# Patient Record
Sex: Female | Born: 1945
Health system: Southern US, Community
[De-identification: ages and names within clinical notes are randomized; demographics above are authoritative.]

## PROBLEM LIST (undated history)

## (undated) DIAGNOSIS — F32A Depression, unspecified: Secondary | ICD-10-CM

## (undated) DIAGNOSIS — E7800 Pure hypercholesterolemia, unspecified: Secondary | ICD-10-CM

## (undated) DIAGNOSIS — E119 Type 2 diabetes mellitus without complications: Secondary | ICD-10-CM

## (undated) DIAGNOSIS — J45909 Unspecified asthma, uncomplicated: Secondary | ICD-10-CM

## (undated) DIAGNOSIS — N816 Rectocele: Secondary | ICD-10-CM

## (undated) DIAGNOSIS — F329 Major depressive disorder, single episode, unspecified: Secondary | ICD-10-CM

## (undated) DIAGNOSIS — Z9109 Other allergy status, other than to drugs and biological substances: Secondary | ICD-10-CM

## (undated) DIAGNOSIS — T7840XA Allergy, unspecified, initial encounter: Secondary | ICD-10-CM

## (undated) DIAGNOSIS — H409 Unspecified glaucoma: Secondary | ICD-10-CM

## (undated) DIAGNOSIS — M1990 Unspecified osteoarthritis, unspecified site: Secondary | ICD-10-CM

## (undated) DIAGNOSIS — G4730 Sleep apnea, unspecified: Secondary | ICD-10-CM

## (undated) DIAGNOSIS — D7581 Myelofibrosis: Secondary | ICD-10-CM

## (undated) DIAGNOSIS — N189 Chronic kidney disease, unspecified: Secondary | ICD-10-CM

## (undated) DIAGNOSIS — F419 Anxiety disorder, unspecified: Secondary | ICD-10-CM

## (undated) HISTORY — DX: Chronic kidney disease, unspecified: N18.9

## (undated) HISTORY — DX: Unspecified glaucoma: H40.9

## (undated) HISTORY — DX: Type 2 diabetes mellitus without complications: E11.9

## (undated) HISTORY — DX: Myelofibrosis: D75.81

## (undated) HISTORY — DX: Pure hypercholesterolemia, unspecified: E78.00

## (undated) HISTORY — DX: Major depressive disorder, single episode, unspecified: F32.9

## (undated) HISTORY — PX: TUBAL LIGATION: SHX77

## (undated) HISTORY — DX: Anxiety disorder, unspecified: F41.9

## (undated) HISTORY — DX: Sleep apnea, unspecified: G47.30

## (undated) HISTORY — DX: Depression, unspecified: F32.A

## (undated) HISTORY — DX: Unspecified osteoarthritis, unspecified site: M19.90

## (undated) HISTORY — DX: Rectocele: N81.6

## (undated) HISTORY — DX: Allergy, unspecified, initial encounter: T78.40XA

## (undated) HISTORY — DX: Unspecified asthma, uncomplicated: J45.909

## (undated) HISTORY — PX: ABDOMINAL HYSTERECTOMY: SUR658

## (undated) HISTORY — DX: Other allergy status, other than to drugs and biological substances: Z91.09

## (undated) HISTORY — PX: COLONOSCOPY: SHX174

---

## 1990-03-05 HISTORY — PX: ABDOMINAL HYSTERECTOMY: SUR658

## 1998-06-01 ENCOUNTER — Other Ambulatory Visit: Admission: RE | Admit: 1998-06-01 | Discharge: 1998-06-01 | Payer: Self-pay | Admitting: Obstetrics and Gynecology

## 1999-06-27 ENCOUNTER — Other Ambulatory Visit: Admission: RE | Admit: 1999-06-27 | Discharge: 1999-06-27 | Payer: Self-pay | Admitting: Obstetrics and Gynecology

## 2000-11-07 ENCOUNTER — Other Ambulatory Visit: Admission: RE | Admit: 2000-11-07 | Discharge: 2000-11-07 | Payer: Self-pay | Admitting: Obstetrics and Gynecology

## 2005-02-02 ENCOUNTER — Ambulatory Visit: Payer: Self-pay | Admitting: Oncology

## 2005-02-07 ENCOUNTER — Ambulatory Visit (HOSPITAL_COMMUNITY): Admission: RE | Admit: 2005-02-07 | Discharge: 2005-02-07 | Payer: Self-pay | Admitting: Oncology

## 2005-07-16 ENCOUNTER — Ambulatory Visit: Payer: Self-pay | Admitting: Oncology

## 2005-07-18 LAB — CBC WITH DIFFERENTIAL/PLATELET
BASO%: 0.7 % (ref 0.0–2.0)
Eosinophils Absolute: 0.1 10*3/uL (ref 0.0–0.5)
HCT: 41.4 % (ref 34.8–46.6)
LYMPH%: 27.3 % (ref 14.0–48.0)
MCHC: 34.6 g/dL (ref 32.0–36.0)
MCV: 90.6 fL (ref 81.0–101.0)
MONO#: 0.4 10*3/uL (ref 0.1–0.9)
MONO%: 9 % (ref 0.0–13.0)
NEUT%: 60.2 % (ref 39.6–76.8)
Platelets: 250 10*3/uL (ref 145–400)
RBC: 4.57 10*6/uL (ref 3.70–5.32)
WBC: 4.3 10*3/uL (ref 3.9–10.0)

## 2005-07-18 LAB — COMPREHENSIVE METABOLIC PANEL
Alkaline Phosphatase: 50 U/L (ref 39–117)
CO2: 27 mEq/L (ref 19–32)
Creatinine, Ser: 1.2 mg/dL (ref 0.4–1.2)
Glucose, Bld: 96 mg/dL (ref 70–99)
Total Bilirubin: 0.7 mg/dL (ref 0.3–1.2)

## 2008-04-08 ENCOUNTER — Ambulatory Visit: Payer: Self-pay | Admitting: Internal Medicine

## 2008-04-22 ENCOUNTER — Ambulatory Visit: Payer: Self-pay | Admitting: Internal Medicine

## 2011-03-15 DIAGNOSIS — H40059 Ocular hypertension, unspecified eye: Secondary | ICD-10-CM | POA: Diagnosis not present

## 2011-03-15 DIAGNOSIS — H1045 Other chronic allergic conjunctivitis: Secondary | ICD-10-CM | POA: Diagnosis not present

## 2011-03-19 DIAGNOSIS — H1045 Other chronic allergic conjunctivitis: Secondary | ICD-10-CM | POA: Diagnosis not present

## 2011-03-19 DIAGNOSIS — J3081 Allergic rhinitis due to animal (cat) (dog) hair and dander: Secondary | ICD-10-CM | POA: Diagnosis not present

## 2011-03-19 DIAGNOSIS — J301 Allergic rhinitis due to pollen: Secondary | ICD-10-CM | POA: Diagnosis not present

## 2011-03-19 DIAGNOSIS — J3089 Other allergic rhinitis: Secondary | ICD-10-CM | POA: Diagnosis not present

## 2011-03-20 DIAGNOSIS — J309 Allergic rhinitis, unspecified: Secondary | ICD-10-CM | POA: Diagnosis not present

## 2011-03-21 DIAGNOSIS — I8310 Varicose veins of unspecified lower extremity with inflammation: Secondary | ICD-10-CM | POA: Diagnosis not present

## 2011-03-23 DIAGNOSIS — I8310 Varicose veins of unspecified lower extremity with inflammation: Secondary | ICD-10-CM | POA: Diagnosis not present

## 2011-03-23 DIAGNOSIS — M79609 Pain in unspecified limb: Secondary | ICD-10-CM | POA: Diagnosis not present

## 2011-04-04 DIAGNOSIS — M79609 Pain in unspecified limb: Secondary | ICD-10-CM | POA: Diagnosis not present

## 2011-04-04 DIAGNOSIS — I8310 Varicose veins of unspecified lower extremity with inflammation: Secondary | ICD-10-CM | POA: Diagnosis not present

## 2011-04-20 DIAGNOSIS — J309 Allergic rhinitis, unspecified: Secondary | ICD-10-CM | POA: Diagnosis not present

## 2011-04-23 DIAGNOSIS — B351 Tinea unguium: Secondary | ICD-10-CM | POA: Diagnosis not present

## 2011-04-23 DIAGNOSIS — M7750 Other enthesopathy of unspecified foot: Secondary | ICD-10-CM | POA: Diagnosis not present

## 2011-05-04 DIAGNOSIS — I8310 Varicose veins of unspecified lower extremity with inflammation: Secondary | ICD-10-CM | POA: Diagnosis not present

## 2011-05-04 DIAGNOSIS — M79609 Pain in unspecified limb: Secondary | ICD-10-CM | POA: Diagnosis not present

## 2011-05-14 DIAGNOSIS — Z79899 Other long term (current) drug therapy: Secondary | ICD-10-CM | POA: Diagnosis not present

## 2011-05-14 DIAGNOSIS — E782 Mixed hyperlipidemia: Secondary | ICD-10-CM | POA: Diagnosis not present

## 2011-05-14 DIAGNOSIS — D7581 Myelofibrosis: Secondary | ICD-10-CM | POA: Diagnosis not present

## 2011-05-16 DIAGNOSIS — N183 Chronic kidney disease, stage 3 unspecified: Secondary | ICD-10-CM | POA: Diagnosis not present

## 2011-05-16 DIAGNOSIS — R109 Unspecified abdominal pain: Secondary | ICD-10-CM | POA: Diagnosis not present

## 2011-05-16 DIAGNOSIS — E7800 Pure hypercholesterolemia, unspecified: Secondary | ICD-10-CM | POA: Diagnosis not present

## 2011-05-16 DIAGNOSIS — J309 Allergic rhinitis, unspecified: Secondary | ICD-10-CM | POA: Diagnosis not present

## 2011-05-21 DIAGNOSIS — M7750 Other enthesopathy of unspecified foot: Secondary | ICD-10-CM | POA: Diagnosis not present

## 2011-05-29 DIAGNOSIS — I8310 Varicose veins of unspecified lower extremity with inflammation: Secondary | ICD-10-CM | POA: Diagnosis not present

## 2011-05-29 DIAGNOSIS — M7981 Nontraumatic hematoma of soft tissue: Secondary | ICD-10-CM | POA: Diagnosis not present

## 2011-05-29 DIAGNOSIS — M79609 Pain in unspecified limb: Secondary | ICD-10-CM | POA: Diagnosis not present

## 2011-06-08 DIAGNOSIS — J309 Allergic rhinitis, unspecified: Secondary | ICD-10-CM | POA: Diagnosis not present

## 2011-06-27 DIAGNOSIS — M7981 Nontraumatic hematoma of soft tissue: Secondary | ICD-10-CM | POA: Diagnosis not present

## 2011-06-27 DIAGNOSIS — I8310 Varicose veins of unspecified lower extremity with inflammation: Secondary | ICD-10-CM | POA: Diagnosis not present

## 2011-06-29 DIAGNOSIS — I8310 Varicose veins of unspecified lower extremity with inflammation: Secondary | ICD-10-CM | POA: Diagnosis not present

## 2011-07-12 DIAGNOSIS — J309 Allergic rhinitis, unspecified: Secondary | ICD-10-CM | POA: Diagnosis not present

## 2011-07-24 DIAGNOSIS — M7981 Nontraumatic hematoma of soft tissue: Secondary | ICD-10-CM | POA: Diagnosis not present

## 2011-07-24 DIAGNOSIS — I8310 Varicose veins of unspecified lower extremity with inflammation: Secondary | ICD-10-CM | POA: Diagnosis not present

## 2011-07-24 DIAGNOSIS — M79609 Pain in unspecified limb: Secondary | ICD-10-CM | POA: Diagnosis not present

## 2011-08-07 DIAGNOSIS — M7981 Nontraumatic hematoma of soft tissue: Secondary | ICD-10-CM | POA: Diagnosis not present

## 2011-08-07 DIAGNOSIS — M79609 Pain in unspecified limb: Secondary | ICD-10-CM | POA: Diagnosis not present

## 2011-08-13 DIAGNOSIS — J309 Allergic rhinitis, unspecified: Secondary | ICD-10-CM | POA: Diagnosis not present

## 2011-10-01 DIAGNOSIS — J309 Allergic rhinitis, unspecified: Secondary | ICD-10-CM | POA: Diagnosis not present

## 2011-10-08 DIAGNOSIS — M7750 Other enthesopathy of unspecified foot: Secondary | ICD-10-CM | POA: Diagnosis not present

## 2011-10-23 DIAGNOSIS — J309 Allergic rhinitis, unspecified: Secondary | ICD-10-CM | POA: Diagnosis not present

## 2011-11-09 DIAGNOSIS — J309 Allergic rhinitis, unspecified: Secondary | ICD-10-CM | POA: Diagnosis not present

## 2011-11-19 DIAGNOSIS — N183 Chronic kidney disease, stage 3 unspecified: Secondary | ICD-10-CM | POA: Diagnosis not present

## 2011-11-19 DIAGNOSIS — E782 Mixed hyperlipidemia: Secondary | ICD-10-CM | POA: Diagnosis not present

## 2011-11-19 DIAGNOSIS — D7581 Myelofibrosis: Secondary | ICD-10-CM | POA: Diagnosis not present

## 2011-11-20 DIAGNOSIS — Z1231 Encounter for screening mammogram for malignant neoplasm of breast: Secondary | ICD-10-CM | POA: Diagnosis not present

## 2011-11-20 DIAGNOSIS — L821 Other seborrheic keratosis: Secondary | ICD-10-CM | POA: Diagnosis not present

## 2011-11-20 DIAGNOSIS — N644 Mastodynia: Secondary | ICD-10-CM | POA: Diagnosis not present

## 2011-11-20 DIAGNOSIS — L739 Follicular disorder, unspecified: Secondary | ICD-10-CM | POA: Diagnosis not present

## 2011-11-23 ENCOUNTER — Other Ambulatory Visit: Payer: Self-pay | Admitting: Family Medicine

## 2011-11-23 DIAGNOSIS — N6310 Unspecified lump in the right breast, unspecified quadrant: Secondary | ICD-10-CM

## 2011-11-23 DIAGNOSIS — F411 Generalized anxiety disorder: Secondary | ICD-10-CM | POA: Diagnosis not present

## 2011-11-23 DIAGNOSIS — N951 Menopausal and female climacteric states: Secondary | ICD-10-CM

## 2011-11-23 DIAGNOSIS — Z23 Encounter for immunization: Secondary | ICD-10-CM | POA: Diagnosis not present

## 2011-11-23 DIAGNOSIS — N183 Chronic kidney disease, stage 3 unspecified: Secondary | ICD-10-CM | POA: Diagnosis not present

## 2011-11-23 DIAGNOSIS — Z6826 Body mass index (BMI) 26.0-26.9, adult: Secondary | ICD-10-CM | POA: Diagnosis not present

## 2011-11-23 DIAGNOSIS — E782 Mixed hyperlipidemia: Secondary | ICD-10-CM | POA: Diagnosis not present

## 2011-11-23 DIAGNOSIS — N644 Mastodynia: Secondary | ICD-10-CM

## 2011-12-03 DIAGNOSIS — J309 Allergic rhinitis, unspecified: Secondary | ICD-10-CM | POA: Diagnosis not present

## 2011-12-04 ENCOUNTER — Other Ambulatory Visit: Payer: Self-pay

## 2011-12-04 ENCOUNTER — Ambulatory Visit
Admission: RE | Admit: 2011-12-04 | Discharge: 2011-12-04 | Disposition: A | Discharge status: Home / Self Care | Payer: Medicare Other | Source: Ambulatory Visit | Attending: Family Medicine | Admitting: Family Medicine

## 2011-12-04 DIAGNOSIS — N6310 Unspecified lump in the right breast, unspecified quadrant: Secondary | ICD-10-CM

## 2011-12-04 DIAGNOSIS — N644 Mastodynia: Secondary | ICD-10-CM

## 2011-12-04 DIAGNOSIS — N951 Menopausal and female climacteric states: Secondary | ICD-10-CM

## 2011-12-04 DIAGNOSIS — Z780 Asymptomatic menopausal state: Secondary | ICD-10-CM | POA: Diagnosis not present

## 2011-12-04 DIAGNOSIS — Z1211 Encounter for screening for malignant neoplasm of colon: Secondary | ICD-10-CM | POA: Diagnosis not present

## 2011-12-31 DIAGNOSIS — J309 Allergic rhinitis, unspecified: Secondary | ICD-10-CM | POA: Diagnosis not present

## 2012-02-01 DIAGNOSIS — J309 Allergic rhinitis, unspecified: Secondary | ICD-10-CM | POA: Diagnosis not present

## 2012-02-05 DIAGNOSIS — J309 Allergic rhinitis, unspecified: Secondary | ICD-10-CM | POA: Diagnosis not present

## 2012-02-22 DIAGNOSIS — J309 Allergic rhinitis, unspecified: Secondary | ICD-10-CM | POA: Diagnosis not present

## 2012-02-29 DIAGNOSIS — J309 Allergic rhinitis, unspecified: Secondary | ICD-10-CM | POA: Diagnosis not present

## 2012-03-04 DIAGNOSIS — J309 Allergic rhinitis, unspecified: Secondary | ICD-10-CM | POA: Diagnosis not present

## 2012-03-07 DIAGNOSIS — J309 Allergic rhinitis, unspecified: Secondary | ICD-10-CM | POA: Diagnosis not present

## 2012-03-10 DIAGNOSIS — J309 Allergic rhinitis, unspecified: Secondary | ICD-10-CM | POA: Diagnosis not present

## 2012-03-14 DIAGNOSIS — J3089 Other allergic rhinitis: Secondary | ICD-10-CM | POA: Diagnosis not present

## 2012-03-14 DIAGNOSIS — J3081 Allergic rhinitis due to animal (cat) (dog) hair and dander: Secondary | ICD-10-CM | POA: Diagnosis not present

## 2012-03-14 DIAGNOSIS — H1045 Other chronic allergic conjunctivitis: Secondary | ICD-10-CM | POA: Diagnosis not present

## 2012-03-14 DIAGNOSIS — J301 Allergic rhinitis due to pollen: Secondary | ICD-10-CM | POA: Diagnosis not present

## 2012-03-14 DIAGNOSIS — M79609 Pain in unspecified limb: Secondary | ICD-10-CM | POA: Diagnosis not present

## 2012-03-17 DIAGNOSIS — H43819 Vitreous degeneration, unspecified eye: Secondary | ICD-10-CM | POA: Diagnosis not present

## 2012-03-17 DIAGNOSIS — R42 Dizziness and giddiness: Secondary | ICD-10-CM | POA: Diagnosis not present

## 2012-03-17 DIAGNOSIS — J309 Allergic rhinitis, unspecified: Secondary | ICD-10-CM | POA: Diagnosis not present

## 2012-03-17 DIAGNOSIS — H1045 Other chronic allergic conjunctivitis: Secondary | ICD-10-CM | POA: Diagnosis not present

## 2012-04-04 DIAGNOSIS — J309 Allergic rhinitis, unspecified: Secondary | ICD-10-CM | POA: Diagnosis not present

## 2012-04-15 DIAGNOSIS — I872 Venous insufficiency (chronic) (peripheral): Secondary | ICD-10-CM | POA: Diagnosis not present

## 2012-04-25 DIAGNOSIS — J309 Allergic rhinitis, unspecified: Secondary | ICD-10-CM | POA: Diagnosis not present

## 2012-05-16 DIAGNOSIS — J309 Allergic rhinitis, unspecified: Secondary | ICD-10-CM | POA: Diagnosis not present

## 2012-05-26 DIAGNOSIS — D7581 Myelofibrosis: Secondary | ICD-10-CM | POA: Diagnosis not present

## 2012-05-26 DIAGNOSIS — E782 Mixed hyperlipidemia: Secondary | ICD-10-CM | POA: Diagnosis not present

## 2012-05-26 DIAGNOSIS — N183 Chronic kidney disease, stage 3 unspecified: Secondary | ICD-10-CM | POA: Diagnosis not present

## 2012-05-28 DIAGNOSIS — Z6829 Body mass index (BMI) 29.0-29.9, adult: Secondary | ICD-10-CM | POA: Diagnosis not present

## 2012-05-28 DIAGNOSIS — N183 Chronic kidney disease, stage 3 unspecified: Secondary | ICD-10-CM | POA: Diagnosis not present

## 2012-05-28 DIAGNOSIS — E782 Mixed hyperlipidemia: Secondary | ICD-10-CM | POA: Diagnosis not present

## 2012-05-28 DIAGNOSIS — F341 Dysthymic disorder: Secondary | ICD-10-CM | POA: Diagnosis not present

## 2012-06-06 DIAGNOSIS — J309 Allergic rhinitis, unspecified: Secondary | ICD-10-CM | POA: Diagnosis not present

## 2012-06-27 DIAGNOSIS — F341 Dysthymic disorder: Secondary | ICD-10-CM | POA: Diagnosis not present

## 2012-07-04 DIAGNOSIS — J309 Allergic rhinitis, unspecified: Secondary | ICD-10-CM | POA: Diagnosis not present

## 2012-07-31 DIAGNOSIS — J309 Allergic rhinitis, unspecified: Secondary | ICD-10-CM | POA: Diagnosis not present

## 2012-08-28 DIAGNOSIS — J309 Allergic rhinitis, unspecified: Secondary | ICD-10-CM | POA: Diagnosis not present

## 2012-09-25 DIAGNOSIS — J309 Allergic rhinitis, unspecified: Secondary | ICD-10-CM | POA: Diagnosis not present

## 2012-10-23 DIAGNOSIS — J309 Allergic rhinitis, unspecified: Secondary | ICD-10-CM | POA: Diagnosis not present

## 2012-10-23 DIAGNOSIS — H903 Sensorineural hearing loss, bilateral: Secondary | ICD-10-CM | POA: Diagnosis not present

## 2012-11-21 DIAGNOSIS — Z1231 Encounter for screening mammogram for malignant neoplasm of breast: Secondary | ICD-10-CM | POA: Diagnosis not present

## 2012-11-21 DIAGNOSIS — Z1289 Encounter for screening for malignant neoplasm of other sites: Secondary | ICD-10-CM | POA: Diagnosis not present

## 2012-11-25 DIAGNOSIS — E782 Mixed hyperlipidemia: Secondary | ICD-10-CM | POA: Diagnosis not present

## 2012-11-25 DIAGNOSIS — J309 Allergic rhinitis, unspecified: Secondary | ICD-10-CM | POA: Diagnosis not present

## 2012-11-25 DIAGNOSIS — D2360 Other benign neoplasm of skin of unspecified upper limb, including shoulder: Secondary | ICD-10-CM | POA: Diagnosis not present

## 2012-11-25 DIAGNOSIS — I789 Disease of capillaries, unspecified: Secondary | ICD-10-CM | POA: Diagnosis not present

## 2012-11-25 DIAGNOSIS — L819 Disorder of pigmentation, unspecified: Secondary | ICD-10-CM | POA: Diagnosis not present

## 2012-11-25 DIAGNOSIS — D7581 Myelofibrosis: Secondary | ICD-10-CM | POA: Diagnosis not present

## 2012-11-25 DIAGNOSIS — N183 Chronic kidney disease, stage 3 unspecified: Secondary | ICD-10-CM | POA: Diagnosis not present

## 2012-11-25 DIAGNOSIS — L738 Other specified follicular disorders: Secondary | ICD-10-CM | POA: Diagnosis not present

## 2012-11-25 DIAGNOSIS — L821 Other seborrheic keratosis: Secondary | ICD-10-CM | POA: Diagnosis not present

## 2012-11-28 DIAGNOSIS — E782 Mixed hyperlipidemia: Secondary | ICD-10-CM | POA: Diagnosis not present

## 2012-11-28 DIAGNOSIS — Z23 Encounter for immunization: Secondary | ICD-10-CM | POA: Diagnosis not present

## 2012-11-28 DIAGNOSIS — N183 Chronic kidney disease, stage 3 unspecified: Secondary | ICD-10-CM | POA: Diagnosis not present

## 2012-11-28 DIAGNOSIS — D7581 Myelofibrosis: Secondary | ICD-10-CM | POA: Diagnosis not present

## 2012-12-25 DIAGNOSIS — J309 Allergic rhinitis, unspecified: Secondary | ICD-10-CM | POA: Diagnosis not present

## 2012-12-29 DIAGNOSIS — J309 Allergic rhinitis, unspecified: Secondary | ICD-10-CM | POA: Diagnosis not present

## 2013-01-22 DIAGNOSIS — J309 Allergic rhinitis, unspecified: Secondary | ICD-10-CM | POA: Diagnosis not present

## 2013-02-17 DIAGNOSIS — J309 Allergic rhinitis, unspecified: Secondary | ICD-10-CM | POA: Diagnosis not present

## 2013-02-19 DIAGNOSIS — J309 Allergic rhinitis, unspecified: Secondary | ICD-10-CM | POA: Diagnosis not present

## 2013-02-23 DIAGNOSIS — J309 Allergic rhinitis, unspecified: Secondary | ICD-10-CM | POA: Diagnosis not present

## 2013-03-11 ENCOUNTER — Encounter: Payer: Self-pay | Admitting: Internal Medicine

## 2013-03-16 ENCOUNTER — Encounter: Payer: Self-pay | Admitting: Internal Medicine

## 2013-03-16 DIAGNOSIS — H259 Unspecified age-related cataract: Secondary | ICD-10-CM | POA: Diagnosis not present

## 2013-03-16 DIAGNOSIS — J3081 Allergic rhinitis due to animal (cat) (dog) hair and dander: Secondary | ICD-10-CM | POA: Diagnosis not present

## 2013-03-16 DIAGNOSIS — H43819 Vitreous degeneration, unspecified eye: Secondary | ICD-10-CM | POA: Diagnosis not present

## 2013-03-16 DIAGNOSIS — H1045 Other chronic allergic conjunctivitis: Secondary | ICD-10-CM | POA: Diagnosis not present

## 2013-03-16 DIAGNOSIS — J3089 Other allergic rhinitis: Secondary | ICD-10-CM | POA: Diagnosis not present

## 2013-03-16 DIAGNOSIS — J301 Allergic rhinitis due to pollen: Secondary | ICD-10-CM | POA: Diagnosis not present

## 2013-03-16 DIAGNOSIS — H524 Presbyopia: Secondary | ICD-10-CM | POA: Diagnosis not present

## 2013-03-17 DIAGNOSIS — J309 Allergic rhinitis, unspecified: Secondary | ICD-10-CM | POA: Diagnosis not present

## 2013-03-20 DIAGNOSIS — J309 Allergic rhinitis, unspecified: Secondary | ICD-10-CM | POA: Diagnosis not present

## 2013-03-23 DIAGNOSIS — J309 Allergic rhinitis, unspecified: Secondary | ICD-10-CM | POA: Diagnosis not present

## 2013-03-30 DIAGNOSIS — J309 Allergic rhinitis, unspecified: Secondary | ICD-10-CM | POA: Diagnosis not present

## 2013-04-14 DIAGNOSIS — J309 Allergic rhinitis, unspecified: Secondary | ICD-10-CM | POA: Diagnosis not present

## 2013-05-05 DIAGNOSIS — J309 Allergic rhinitis, unspecified: Secondary | ICD-10-CM | POA: Diagnosis not present

## 2013-05-06 ENCOUNTER — Ambulatory Visit (AMBULATORY_SURGERY_CENTER): Payer: Self-pay

## 2013-05-06 VITALS — Ht 63.0 in | Wt 140.0 lb

## 2013-05-06 DIAGNOSIS — Z800 Family history of malignant neoplasm of digestive organs: Secondary | ICD-10-CM

## 2013-05-06 MED ORDER — MOVIPREP 100 G PO SOLR: 1.0000 | Freq: Once | ORAL | Status: DC

## 2013-05-19 ENCOUNTER — Encounter: Payer: Self-pay | Admitting: *Deleted

## 2013-05-20 ENCOUNTER — Encounter: Payer: Self-pay | Admitting: Internal Medicine

## 2013-05-20 ENCOUNTER — Ambulatory Visit (AMBULATORY_SURGERY_CENTER): Payer: Medicare Other | Admitting: Internal Medicine

## 2013-05-20 VITALS — BP 117/60 | HR 69 | Temp 97.4°F | Resp 13 | Ht 63.0 in | Wt 130.0 lb

## 2013-05-20 DIAGNOSIS — Z1211 Encounter for screening for malignant neoplasm of colon: Secondary | ICD-10-CM | POA: Diagnosis not present

## 2013-05-20 DIAGNOSIS — G4733 Obstructive sleep apnea (adult) (pediatric): Secondary | ICD-10-CM | POA: Diagnosis not present

## 2013-05-20 DIAGNOSIS — Z800 Family history of malignant neoplasm of digestive organs: Secondary | ICD-10-CM

## 2013-05-20 MED ORDER — SODIUM CHLORIDE 0.9 % IV SOLN: 500.0000 mL | INTRAVENOUS | Status: DC

## 2013-05-20 NOTE — Patient Instructions (Signed)
YOU HAD AN ENDOSCOPIC PROCEDURE TODAY AT [facility]: Refer to the procedure report that was given to you for any specific questions about what was found during the examination.  If the procedure report does not answer your questions, please call your gastroenterologist to clarify.  If you requested that your care partner not be given the details of your procedure findings, then the procedure report has been included in a sealed envelope for you to review at your convenience later.  YOU SHOULD EXPECT: Some feelings of bloating in the abdomen. Passage of more gas than usual.  Walking can help get rid of the air that was put into your GI tract during the procedure and reduce the bloating. If you had a lower endoscopy (such as a colonoscopy or flexible sigmoidoscopy) you may notice spotting of blood in your stool or on the toilet paper. If you underwent a bowel prep for your procedure, then you may not have a normal bowel movement for a few days.  DIET: Your first meal following the procedure should be a light meal and then it is ok to progress to your normal diet.  A half-sandwich or bowl of soup is an example of a good first meal.  Heavy or fried foods are harder to digest and may make you feel nauseous or bloated.  Likewise meals heavy in dairy and vegetables can cause extra gas to form and this can also increase the bloating.  Drink plenty of fluids but you should avoid alcoholic beverages for 24 hours.  ACTIVITY: Your care partner should take you home directly after the procedure.  You should plan to take it easy, moving slowly for the rest of the day.  You can resume normal activity the day after the procedure however you should NOT DRIVE or use heavy machinery for 24 hours (because of the sedation medicines used during the test).    SYMPTOMS TO REPORT IMMEDIATELY: A gastroenterologist can be reached at any hour.  During normal business hours, 8:30 AM to 5:00 PM Monday through Friday,  call [phone].  After hours and on weekends, please call the GI answering service at [phone] who will take a message and have the physician on call contact you.   Following lower endoscopy (colonoscopy or flexible sigmoidoscopy):  Excessive amounts of blood in the stool  Significant tenderness or worsening of abdominal pains  Swelling of the abdomen that is new, acute  Fever of 100F or higher  FOLLOW UP: Our staff will call the home number listed on your records the next business day following your procedure to check on you and address any questions or concerns that you may have at that time regarding the information given to you following your procedure. This is a courtesy call and so if there is no answer at the home number and we have not heard from you through the emergency physician on call, we will assume that you have returned to your regular daily activities without incident.  SIGNATURES/CONFIDENTIALITY: You and/or your care partner have signed paperwork which will be entered into your electronic medical record.  These signatures attest to the fact that that the information above on your After Visit Summary has been reviewed and is understood.  Full responsibility of the confidentiality of this discharge information lies with you and/or your care-partner.  Follow up colon in 5 years  Please read over handout about high fiber diets  Continue your normal medications

## 2013-05-20 NOTE — Op Note (Signed)
[facility]  [address] [place], [id number]   COLONOSCOPY PROCEDURE REPORT  PATIENT: Holly, Nixon  MR#: [id number] BIRTHDATE: [birth year] , 67  yrs. old GENDER: Female ENDOSCOPIST: Lafayette Dragon, MD REFERRED HL:[id number] Hamrick, M.D. PROCEDURE DATE:  05/20/2013 PROCEDURE:   Colonoscopy, screening First Screening Colonoscopy - Avg.  risk and is 50 yrs.  old or older - No.  Prior Negative Screening - Now for repeat screening. 10 or more years since last screening Prior Negative Screening - Now for repeat screening.  Above average risk  History of Adenoma - Now for follow-up colonoscopy & has been > or = to 3 yrs.  N/A Polyps Removed Today? No.  Recommend repeat exam, <10 yrs? Yes. High risk (family or personal hx). ASA CLASS:   Class II INDICATIONS:Patient's immediate family history of colon cancer and history of colon cancer in a parent. MEDICATIONS: MAC sedation, administered by CRNA and propofol (Diprivan) 250mg  IV  DESCRIPTION OF PROCEDURE:   After the risks benefits and alternatives of the procedure were thoroughly explained, informed consent was obtained.  A digital rectal exam revealed no abnormalities of the rectum.   The LB [id number]-[id number]  endoscope was introduced through the anus and advanced to the cecum, which was identified by both the appendix and ileocecal valve. No adverse events experienced.   The quality of the prep was good, using MoviPrep  The instrument was then slowly withdrawn as the colon was fully examined.      COLON FINDINGS: A normal appearing cecum, ileocecal valve, and appendiceal orifice were identified.  The ascending, hepatic flexure, transverse, splenic flexure, descending, sigmoid colon and rectum appeared unremarkable.  No polyps or cancers were seen. Retroflexed views revealed no abnormalities. The time to cecum=6 minutes 45 seconds.  Withdrawal time=6 minutes 15 seconds.  The scope was withdrawn and the procedure  completed. COMPLICATIONS: There were no complications.  ENDOSCOPIC IMPRESSION: Normal colon  RECOMMENDATIONS: high fiber diet Recall colonoscopy in 5 years   eSigned:  Lafayette Dragon, MD 05/20/2013 9:18 AM   cc:

## 2013-05-20 NOTE — Progress Notes (Signed)
Lidocaine-40mg IV prior to Propofol InductionPropofol given over incremental dosages 

## 2013-05-21 ENCOUNTER — Telehealth: Payer: Self-pay | Admitting: *Deleted

## 2013-05-21 NOTE — Telephone Encounter (Signed)
  Follow up Call-  Call back number 05/20/2013  Post procedure Call Back phone  # [phone]  Permission to leave phone message Yes     Patient questions:  Do you have a fever, pain , or abdominal swelling? no Pain Score  0 *  Have you tolerated food without any problems? yes  Have you been able to return to your normal activities? yes  Do you have any questions about your discharge instructions: Diet   no Medications  no Follow up visit  no  Do you have questions or concerns about your Care? no  Actions: * If pain score is 4 or above: No action needed, pain <4.  Pt states, "I want this added to my record.  After waking up from the procedure yesterday, my nose kept running.  I felt like I was having an allergy attack up until about 5 a.m. This morning.  I took my allergy pill and then a Benadryl and it seems to be better now.  I'm going to call my allergist this a.m. And see if he feels this could have been from the Propofol."  I told pt that she received oxygen through her nasal cannula and this could have dried her nose out and irritated it.  She will call back if she has any further concerns or needs

## 2013-06-02 DIAGNOSIS — J309 Allergic rhinitis, unspecified: Secondary | ICD-10-CM | POA: Diagnosis not present

## 2013-06-22 DIAGNOSIS — N183 Chronic kidney disease, stage 3 unspecified: Secondary | ICD-10-CM | POA: Diagnosis not present

## 2013-06-22 DIAGNOSIS — E782 Mixed hyperlipidemia: Secondary | ICD-10-CM | POA: Diagnosis not present

## 2013-06-22 DIAGNOSIS — D7581 Myelofibrosis: Secondary | ICD-10-CM | POA: Diagnosis not present

## 2013-06-24 DIAGNOSIS — E782 Mixed hyperlipidemia: Secondary | ICD-10-CM | POA: Diagnosis not present

## 2013-06-24 DIAGNOSIS — Z1331 Encounter for screening for depression: Secondary | ICD-10-CM | POA: Diagnosis not present

## 2013-06-29 DIAGNOSIS — J309 Allergic rhinitis, unspecified: Secondary | ICD-10-CM | POA: Diagnosis not present

## 2013-07-24 DIAGNOSIS — J309 Allergic rhinitis, unspecified: Secondary | ICD-10-CM | POA: Diagnosis not present

## 2013-09-18 DIAGNOSIS — J309 Allergic rhinitis, unspecified: Secondary | ICD-10-CM | POA: Diagnosis not present

## 2013-10-23 DIAGNOSIS — J309 Allergic rhinitis, unspecified: Secondary | ICD-10-CM | POA: Diagnosis not present

## 2013-12-01 DIAGNOSIS — J309 Allergic rhinitis, unspecified: Secondary | ICD-10-CM | POA: Diagnosis not present

## 2013-12-02 DIAGNOSIS — Z1231 Encounter for screening mammogram for malignant neoplasm of breast: Secondary | ICD-10-CM | POA: Diagnosis not present

## 2013-12-02 DIAGNOSIS — N63 Unspecified lump in unspecified breast: Secondary | ICD-10-CM | POA: Diagnosis not present

## 2013-12-03 ENCOUNTER — Other Ambulatory Visit: Payer: Self-pay | Admitting: Obstetrics and Gynecology

## 2013-12-03 DIAGNOSIS — N63 Unspecified lump in unspecified breast: Secondary | ICD-10-CM

## 2013-12-28 DIAGNOSIS — Z23 Encounter for immunization: Secondary | ICD-10-CM | POA: Diagnosis not present

## 2013-12-28 DIAGNOSIS — D7581 Myelofibrosis: Secondary | ICD-10-CM | POA: Diagnosis not present

## 2013-12-28 DIAGNOSIS — E782 Mixed hyperlipidemia: Secondary | ICD-10-CM | POA: Diagnosis not present

## 2013-12-28 DIAGNOSIS — N183 Chronic kidney disease, stage 3 (moderate): Secondary | ICD-10-CM | POA: Diagnosis not present

## 2013-12-29 DIAGNOSIS — J309 Allergic rhinitis, unspecified: Secondary | ICD-10-CM | POA: Diagnosis not present

## 2013-12-29 DIAGNOSIS — F418 Other specified anxiety disorders: Secondary | ICD-10-CM | POA: Diagnosis not present

## 2013-12-29 DIAGNOSIS — N183 Chronic kidney disease, stage 3 (moderate): Secondary | ICD-10-CM | POA: Diagnosis not present

## 2013-12-29 DIAGNOSIS — E782 Mixed hyperlipidemia: Secondary | ICD-10-CM | POA: Diagnosis not present

## 2013-12-29 DIAGNOSIS — G4700 Insomnia, unspecified: Secondary | ICD-10-CM | POA: Diagnosis not present

## 2013-12-29 DIAGNOSIS — Z23 Encounter for immunization: Secondary | ICD-10-CM | POA: Diagnosis not present

## 2013-12-29 DIAGNOSIS — D7581 Myelofibrosis: Secondary | ICD-10-CM | POA: Diagnosis not present

## 2014-01-07 DIAGNOSIS — L821 Other seborrheic keratosis: Secondary | ICD-10-CM | POA: Diagnosis not present

## 2014-01-07 DIAGNOSIS — D1801 Hemangioma of skin and subcutaneous tissue: Secondary | ICD-10-CM | POA: Diagnosis not present

## 2014-02-09 DIAGNOSIS — J309 Allergic rhinitis, unspecified: Secondary | ICD-10-CM | POA: Diagnosis not present

## 2014-02-10 DIAGNOSIS — J301 Allergic rhinitis due to pollen: Secondary | ICD-10-CM | POA: Diagnosis not present

## 2014-02-10 DIAGNOSIS — J3089 Other allergic rhinitis: Secondary | ICD-10-CM | POA: Diagnosis not present

## 2014-03-09 DIAGNOSIS — J309 Allergic rhinitis, unspecified: Secondary | ICD-10-CM | POA: Diagnosis not present

## 2014-03-17 DIAGNOSIS — J3089 Other allergic rhinitis: Secondary | ICD-10-CM | POA: Diagnosis not present

## 2014-03-17 DIAGNOSIS — J3081 Allergic rhinitis due to animal (cat) (dog) hair and dander: Secondary | ICD-10-CM | POA: Diagnosis not present

## 2014-03-17 DIAGNOSIS — H1045 Other chronic allergic conjunctivitis: Secondary | ICD-10-CM | POA: Diagnosis not present

## 2014-03-17 DIAGNOSIS — J301 Allergic rhinitis due to pollen: Secondary | ICD-10-CM | POA: Diagnosis not present

## 2014-03-18 DIAGNOSIS — I8312 Varicose veins of left lower extremity with inflammation: Secondary | ICD-10-CM | POA: Diagnosis not present

## 2014-03-18 DIAGNOSIS — I8311 Varicose veins of right lower extremity with inflammation: Secondary | ICD-10-CM | POA: Diagnosis not present

## 2014-04-06 DIAGNOSIS — I872 Venous insufficiency (chronic) (peripheral): Secondary | ICD-10-CM | POA: Diagnosis not present

## 2014-04-06 DIAGNOSIS — J309 Allergic rhinitis, unspecified: Secondary | ICD-10-CM | POA: Diagnosis not present

## 2014-04-08 DIAGNOSIS — J309 Allergic rhinitis, unspecified: Secondary | ICD-10-CM | POA: Diagnosis not present

## 2014-04-12 DIAGNOSIS — J309 Allergic rhinitis, unspecified: Secondary | ICD-10-CM | POA: Diagnosis not present

## 2014-04-14 DIAGNOSIS — J309 Allergic rhinitis, unspecified: Secondary | ICD-10-CM | POA: Diagnosis not present

## 2014-04-16 DIAGNOSIS — J309 Allergic rhinitis, unspecified: Secondary | ICD-10-CM | POA: Diagnosis not present

## 2014-05-27 DIAGNOSIS — J309 Allergic rhinitis, unspecified: Secondary | ICD-10-CM | POA: Diagnosis not present

## 2014-07-01 DIAGNOSIS — J309 Allergic rhinitis, unspecified: Secondary | ICD-10-CM | POA: Diagnosis not present

## 2014-07-05 DIAGNOSIS — E782 Mixed hyperlipidemia: Secondary | ICD-10-CM | POA: Diagnosis not present

## 2014-07-05 DIAGNOSIS — D7581 Myelofibrosis: Secondary | ICD-10-CM | POA: Diagnosis not present

## 2014-07-05 DIAGNOSIS — Z79899 Other long term (current) drug therapy: Secondary | ICD-10-CM | POA: Diagnosis not present

## 2014-07-07 ENCOUNTER — Other Ambulatory Visit: Payer: Self-pay | Admitting: Family Medicine

## 2014-07-07 DIAGNOSIS — E2839 Other primary ovarian failure: Secondary | ICD-10-CM | POA: Diagnosis not present

## 2014-07-07 DIAGNOSIS — D7581 Myelofibrosis: Secondary | ICD-10-CM | POA: Diagnosis not present

## 2014-07-07 DIAGNOSIS — E782 Mixed hyperlipidemia: Secondary | ICD-10-CM | POA: Diagnosis not present

## 2014-07-07 DIAGNOSIS — F418 Other specified anxiety disorders: Secondary | ICD-10-CM | POA: Diagnosis not present

## 2014-07-07 DIAGNOSIS — N183 Chronic kidney disease, stage 3 (moderate): Secondary | ICD-10-CM | POA: Diagnosis not present

## 2014-07-07 DIAGNOSIS — Z9181 History of falling: Secondary | ICD-10-CM | POA: Diagnosis not present

## 2014-08-04 ENCOUNTER — Other Ambulatory Visit: Payer: Medicare Other

## 2014-08-06 DIAGNOSIS — J309 Allergic rhinitis, unspecified: Secondary | ICD-10-CM | POA: Diagnosis not present

## 2014-08-11 ENCOUNTER — Ambulatory Visit
Admission: RE | Admit: 2014-08-11 | Discharge: 2014-08-11 | Disposition: A | Discharge status: Home / Self Care | Payer: Medicare Other | Source: Ambulatory Visit | Attending: Family Medicine | Admitting: Family Medicine

## 2014-08-11 DIAGNOSIS — Z780 Asymptomatic menopausal state: Secondary | ICD-10-CM | POA: Diagnosis not present

## 2014-08-11 DIAGNOSIS — E2839 Other primary ovarian failure: Secondary | ICD-10-CM

## 2014-08-11 DIAGNOSIS — Z13820 Encounter for screening for osteoporosis: Secondary | ICD-10-CM | POA: Diagnosis not present

## 2014-09-02 DIAGNOSIS — H10403 Unspecified chronic conjunctivitis, bilateral: Secondary | ICD-10-CM | POA: Diagnosis not present

## 2014-09-02 DIAGNOSIS — H04123 Dry eye syndrome of bilateral lacrimal glands: Secondary | ICD-10-CM | POA: Diagnosis not present

## 2014-09-02 DIAGNOSIS — H43813 Vitreous degeneration, bilateral: Secondary | ICD-10-CM | POA: Diagnosis not present

## 2014-09-02 DIAGNOSIS — H25013 Cortical age-related cataract, bilateral: Secondary | ICD-10-CM | POA: Diagnosis not present

## 2014-09-03 DIAGNOSIS — J309 Allergic rhinitis, unspecified: Secondary | ICD-10-CM | POA: Diagnosis not present

## 2014-10-05 DIAGNOSIS — J309 Allergic rhinitis, unspecified: Secondary | ICD-10-CM | POA: Diagnosis not present

## 2014-11-12 DIAGNOSIS — J309 Allergic rhinitis, unspecified: Secondary | ICD-10-CM | POA: Diagnosis not present

## 2014-12-08 DIAGNOSIS — Z1231 Encounter for screening mammogram for malignant neoplasm of breast: Secondary | ICD-10-CM | POA: Diagnosis not present

## 2014-12-08 DIAGNOSIS — Z124 Encounter for screening for malignant neoplasm of cervix: Secondary | ICD-10-CM | POA: Diagnosis not present

## 2014-12-08 DIAGNOSIS — Z01419 Encounter for gynecological examination (general) (routine) without abnormal findings: Secondary | ICD-10-CM | POA: Diagnosis not present

## 2014-12-13 DIAGNOSIS — J309 Allergic rhinitis, unspecified: Secondary | ICD-10-CM | POA: Diagnosis not present

## 2014-12-17 DIAGNOSIS — Z23 Encounter for immunization: Secondary | ICD-10-CM | POA: Diagnosis not present

## 2015-01-10 DIAGNOSIS — J309 Allergic rhinitis, unspecified: Secondary | ICD-10-CM | POA: Diagnosis not present

## 2015-01-18 ENCOUNTER — Encounter: Payer: Self-pay | Admitting: Internal Medicine

## 2015-01-24 ENCOUNTER — Ambulatory Visit (INDEPENDENT_AMBULATORY_CARE_PROVIDER_SITE_OTHER): Payer: Medicare Other

## 2015-01-24 ENCOUNTER — Ambulatory Visit (INDEPENDENT_AMBULATORY_CARE_PROVIDER_SITE_OTHER): Payer: Medicare Other | Admitting: Podiatry

## 2015-01-24 ENCOUNTER — Encounter: Payer: Self-pay | Admitting: Podiatry

## 2015-01-24 VITALS — BP 118/63 | HR 84 | Resp 16

## 2015-01-24 DIAGNOSIS — K1370 Unspecified lesions of oral mucosa: Secondary | ICD-10-CM | POA: Diagnosis not present

## 2015-01-24 DIAGNOSIS — M779 Enthesopathy, unspecified: Secondary | ICD-10-CM

## 2015-01-24 DIAGNOSIS — E782 Mixed hyperlipidemia: Secondary | ICD-10-CM | POA: Diagnosis not present

## 2015-01-24 DIAGNOSIS — D7581 Myelofibrosis: Secondary | ICD-10-CM | POA: Diagnosis not present

## 2015-01-24 DIAGNOSIS — N183 Chronic kidney disease, stage 3 (moderate): Secondary | ICD-10-CM | POA: Diagnosis not present

## 2015-01-24 NOTE — Progress Notes (Signed)
   Subjective:    Patient ID: Holly Nixon, female    DOB: Sep 09, 1945, 69 y.o.   MRN: [id number]  HPI Comments: "The top and side of my foot hurts"  Patient presents with: Foot Pain: Dorsal midfoot and lateral side right - aching for about 1 week, been walking more for exercise, no redness or swelling, no treatment    Foot Pain      Review of Systems  All other systems reviewed and are negative.      Objective:   Physical Exam        Assessment & Plan:

## 2015-01-25 NOTE — Progress Notes (Signed)
Subjective:     Patient ID: Holly Nixon, female   DOB: [birth year], 69 y.o.   MRN: [id number]  HPI patient states she's been getting pain in the top and the lateral side of the right foot that's been hurting for about a week and that she has started new orthotics which have been helpful for condition   Review of Systems  All other systems reviewed and are negative.      Objective:   Physical Exam  Constitutional: She is oriented to person, place, and time.  Cardiovascular: Intact distal pulses.   Musculoskeletal: Normal range of motion.  Neurological: She is oriented to person, place, and time.  Skin: Skin is warm.  Nursing note and vitals reviewed.  neurovascular status intact muscle strength adequate range of motion found to be within normal limits and patient noted to have discomfort in the lateral side of the right foot mild in its intensity with no overt discomfort noted. Patient does not have signs of excessive swelling or pitting edema and was found to have good digital perfusion     Assessment:     Lateral tendinitis right with no indication of fracture    Plan:     H&P and x-ray reviewed. Today I went ahead and advised on ice therapy continued supportive shoe gear usage and if symptoms were to persist consideration for cortisone injection. Reappoint to recheck

## 2015-01-26 DIAGNOSIS — Z9181 History of falling: Secondary | ICD-10-CM | POA: Diagnosis not present

## 2015-01-26 DIAGNOSIS — N183 Chronic kidney disease, stage 3 (moderate): Secondary | ICD-10-CM | POA: Diagnosis not present

## 2015-01-26 DIAGNOSIS — Z87891 Personal history of nicotine dependence: Secondary | ICD-10-CM | POA: Diagnosis not present

## 2015-01-26 DIAGNOSIS — Z6827 Body mass index (BMI) 27.0-27.9, adult: Secondary | ICD-10-CM | POA: Diagnosis not present

## 2015-01-26 DIAGNOSIS — D7581 Myelofibrosis: Secondary | ICD-10-CM | POA: Diagnosis not present

## 2015-01-26 DIAGNOSIS — E782 Mixed hyperlipidemia: Secondary | ICD-10-CM | POA: Diagnosis not present

## 2015-01-26 DIAGNOSIS — E663 Overweight: Secondary | ICD-10-CM | POA: Diagnosis not present

## 2015-01-26 DIAGNOSIS — F418 Other specified anxiety disorders: Secondary | ICD-10-CM | POA: Diagnosis not present

## 2015-02-04 DIAGNOSIS — J3089 Other allergic rhinitis: Secondary | ICD-10-CM | POA: Diagnosis not present

## 2015-02-04 DIAGNOSIS — J301 Allergic rhinitis due to pollen: Secondary | ICD-10-CM | POA: Diagnosis not present

## 2015-02-07 DIAGNOSIS — J309 Allergic rhinitis, unspecified: Secondary | ICD-10-CM | POA: Diagnosis not present

## 2015-02-17 DIAGNOSIS — M272 Inflammatory conditions of jaws: Secondary | ICD-10-CM | POA: Diagnosis not present

## 2015-02-17 DIAGNOSIS — J069 Acute upper respiratory infection, unspecified: Secondary | ICD-10-CM | POA: Diagnosis not present

## 2015-02-17 DIAGNOSIS — Z6828 Body mass index (BMI) 28.0-28.9, adult: Secondary | ICD-10-CM | POA: Diagnosis not present

## 2015-02-17 DIAGNOSIS — H6122 Impacted cerumen, left ear: Secondary | ICD-10-CM | POA: Diagnosis not present

## 2015-02-17 DIAGNOSIS — K045 Chronic apical periodontitis: Secondary | ICD-10-CM | POA: Diagnosis not present

## 2015-04-01 DIAGNOSIS — R413 Other amnesia: Secondary | ICD-10-CM | POA: Diagnosis not present

## 2015-04-01 DIAGNOSIS — G4733 Obstructive sleep apnea (adult) (pediatric): Secondary | ICD-10-CM | POA: Diagnosis not present

## 2015-04-01 DIAGNOSIS — Z6827 Body mass index (BMI) 27.0-27.9, adult: Secondary | ICD-10-CM | POA: Diagnosis not present

## 2015-04-26 DIAGNOSIS — M6248 Contracture of muscle, other site: Secondary | ICD-10-CM | POA: Diagnosis not present

## 2015-04-26 DIAGNOSIS — M47894 Other spondylosis, thoracic region: Secondary | ICD-10-CM | POA: Diagnosis not present

## 2015-04-26 DIAGNOSIS — R0602 Shortness of breath: Secondary | ICD-10-CM | POA: Diagnosis not present

## 2015-04-28 DIAGNOSIS — M545 Low back pain: Secondary | ICD-10-CM | POA: Diagnosis not present

## 2015-04-28 DIAGNOSIS — M461 Sacroiliitis, not elsewhere classified: Secondary | ICD-10-CM | POA: Diagnosis not present

## 2015-04-28 DIAGNOSIS — R2689 Other abnormalities of gait and mobility: Secondary | ICD-10-CM | POA: Diagnosis not present

## 2015-04-28 DIAGNOSIS — M5432 Sciatica, left side: Secondary | ICD-10-CM | POA: Diagnosis not present

## 2015-04-28 DIAGNOSIS — M5431 Sciatica, right side: Secondary | ICD-10-CM | POA: Diagnosis not present

## 2015-04-28 DIAGNOSIS — M6248 Contracture of muscle, other site: Secondary | ICD-10-CM | POA: Diagnosis not present

## 2015-05-03 DIAGNOSIS — J3089 Other allergic rhinitis: Secondary | ICD-10-CM | POA: Diagnosis not present

## 2015-05-03 DIAGNOSIS — M545 Low back pain: Secondary | ICD-10-CM | POA: Diagnosis not present

## 2015-05-03 DIAGNOSIS — M6248 Contracture of muscle, other site: Secondary | ICD-10-CM | POA: Diagnosis not present

## 2015-05-05 DIAGNOSIS — M5432 Sciatica, left side: Secondary | ICD-10-CM | POA: Diagnosis not present

## 2015-05-05 DIAGNOSIS — M545 Low back pain: Secondary | ICD-10-CM | POA: Diagnosis not present

## 2015-05-05 DIAGNOSIS — M5431 Sciatica, right side: Secondary | ICD-10-CM | POA: Diagnosis not present

## 2015-05-05 DIAGNOSIS — M461 Sacroiliitis, not elsewhere classified: Secondary | ICD-10-CM | POA: Diagnosis not present

## 2015-05-05 DIAGNOSIS — M6248 Contracture of muscle, other site: Secondary | ICD-10-CM | POA: Diagnosis not present

## 2015-05-19 DIAGNOSIS — M5431 Sciatica, right side: Secondary | ICD-10-CM | POA: Diagnosis not present

## 2015-05-19 DIAGNOSIS — M461 Sacroiliitis, not elsewhere classified: Secondary | ICD-10-CM | POA: Diagnosis not present

## 2015-05-19 DIAGNOSIS — M545 Low back pain: Secondary | ICD-10-CM | POA: Diagnosis not present

## 2015-05-19 DIAGNOSIS — M6248 Contracture of muscle, other site: Secondary | ICD-10-CM | POA: Diagnosis not present

## 2015-05-19 DIAGNOSIS — M5432 Sciatica, left side: Secondary | ICD-10-CM | POA: Diagnosis not present

## 2015-05-26 DIAGNOSIS — M5432 Sciatica, left side: Secondary | ICD-10-CM | POA: Diagnosis not present

## 2015-05-26 DIAGNOSIS — M5431 Sciatica, right side: Secondary | ICD-10-CM | POA: Diagnosis not present

## 2015-05-26 DIAGNOSIS — M545 Low back pain: Secondary | ICD-10-CM | POA: Diagnosis not present

## 2015-05-26 DIAGNOSIS — M461 Sacroiliitis, not elsewhere classified: Secondary | ICD-10-CM | POA: Diagnosis not present

## 2015-05-26 DIAGNOSIS — M6248 Contracture of muscle, other site: Secondary | ICD-10-CM | POA: Diagnosis not present

## 2015-06-02 DIAGNOSIS — M5432 Sciatica, left side: Secondary | ICD-10-CM | POA: Diagnosis not present

## 2015-06-02 DIAGNOSIS — M545 Low back pain: Secondary | ICD-10-CM | POA: Diagnosis not present

## 2015-06-02 DIAGNOSIS — M461 Sacroiliitis, not elsewhere classified: Secondary | ICD-10-CM | POA: Diagnosis not present

## 2015-06-02 DIAGNOSIS — M6248 Contracture of muscle, other site: Secondary | ICD-10-CM | POA: Diagnosis not present

## 2015-06-02 DIAGNOSIS — M5431 Sciatica, right side: Secondary | ICD-10-CM | POA: Diagnosis not present

## 2015-06-06 DIAGNOSIS — L821 Other seborrheic keratosis: Secondary | ICD-10-CM | POA: Diagnosis not present

## 2015-06-06 DIAGNOSIS — D225 Melanocytic nevi of trunk: Secondary | ICD-10-CM | POA: Diagnosis not present

## 2015-06-09 ENCOUNTER — Emergency Department (HOSPITAL_COMMUNITY)
Admission: EM | Admit: 2015-06-09 | Discharge: 2015-06-09 | Disposition: A | Discharge status: Home / Self Care | Payer: Medicare Other | Attending: Emergency Medicine | Admitting: Emergency Medicine

## 2015-06-09 ENCOUNTER — Encounter (HOSPITAL_COMMUNITY): Payer: Self-pay | Admitting: *Deleted

## 2015-06-09 ENCOUNTER — Emergency Department (HOSPITAL_COMMUNITY): Payer: Medicare Other

## 2015-06-09 DIAGNOSIS — Z8669 Personal history of other diseases of the nervous system and sense organs: Secondary | ICD-10-CM | POA: Insufficient documentation

## 2015-06-09 DIAGNOSIS — R140 Abdominal distension (gaseous): Secondary | ICD-10-CM | POA: Diagnosis not present

## 2015-06-09 DIAGNOSIS — Z880 Allergy status to penicillin: Secondary | ICD-10-CM | POA: Diagnosis not present

## 2015-06-09 DIAGNOSIS — J3089 Other allergic rhinitis: Secondary | ICD-10-CM | POA: Diagnosis not present

## 2015-06-09 DIAGNOSIS — Z79818 Long term (current) use of other agents affecting estrogen receptors and estrogen levels: Secondary | ICD-10-CM | POA: Diagnosis not present

## 2015-06-09 DIAGNOSIS — Z79899 Other long term (current) drug therapy: Secondary | ICD-10-CM | POA: Diagnosis not present

## 2015-06-09 DIAGNOSIS — E7800 Pure hypercholesterolemia, unspecified: Secondary | ICD-10-CM | POA: Diagnosis not present

## 2015-06-09 DIAGNOSIS — R0602 Shortness of breath: Secondary | ICD-10-CM

## 2015-06-09 DIAGNOSIS — R000 Tachycardia, unspecified: Secondary | ICD-10-CM | POA: Insufficient documentation

## 2015-06-09 DIAGNOSIS — Z87891 Personal history of nicotine dependence: Secondary | ICD-10-CM | POA: Diagnosis not present

## 2015-06-09 DIAGNOSIS — E782 Mixed hyperlipidemia: Secondary | ICD-10-CM | POA: Diagnosis not present

## 2015-06-09 DIAGNOSIS — M2550 Pain in unspecified joint: Secondary | ICD-10-CM | POA: Diagnosis not present

## 2015-06-09 DIAGNOSIS — Z862 Personal history of diseases of the blood and blood-forming organs and certain disorders involving the immune mechanism: Secondary | ICD-10-CM | POA: Insufficient documentation

## 2015-06-09 DIAGNOSIS — F329 Major depressive disorder, single episode, unspecified: Secondary | ICD-10-CM | POA: Diagnosis not present

## 2015-06-09 DIAGNOSIS — R5383 Other fatigue: Secondary | ICD-10-CM | POA: Diagnosis not present

## 2015-06-09 DIAGNOSIS — D7581 Myelofibrosis: Secondary | ICD-10-CM | POA: Diagnosis not present

## 2015-06-09 DIAGNOSIS — J3081 Allergic rhinitis due to animal (cat) (dog) hair and dander: Secondary | ICD-10-CM | POA: Diagnosis not present

## 2015-06-09 DIAGNOSIS — I10 Essential (primary) hypertension: Secondary | ICD-10-CM | POA: Diagnosis not present

## 2015-06-09 DIAGNOSIS — N183 Chronic kidney disease, stage 3 (moderate): Secondary | ICD-10-CM | POA: Diagnosis not present

## 2015-06-09 DIAGNOSIS — J301 Allergic rhinitis due to pollen: Secondary | ICD-10-CM | POA: Diagnosis not present

## 2015-06-09 LAB — CBC
HEMATOCRIT: 41.7 % (ref 36.0–46.0)
HEMOGLOBIN: 13.7 g/dL (ref 12.0–15.0)
MCH: 30 pg (ref 26.0–34.0)
MCHC: 32.9 g/dL (ref 30.0–36.0)
MCV: 91.2 fL (ref 78.0–100.0)
Platelets: 224 10*3/uL (ref 150–400)
RBC: 4.57 MIL/uL (ref 3.87–5.11)
RDW: 14.2 % (ref 11.5–15.5)
WBC: 5.1 10*3/uL (ref 4.0–10.5)

## 2015-06-09 LAB — D-DIMER, QUANTITATIVE: D-Dimer, Quant: <0.27 ug/mL-FEU (ref 0.00–0.50)

## 2015-06-09 LAB — BASIC METABOLIC PANEL
ANION GAP: 15 (ref 5–15)
BUN: 22 mg/dL — ABNORMAL HIGH (ref 6–20)
CALCIUM: 10.1 mg/dL (ref 8.9–10.3)
CHLORIDE: 104 mmol/L (ref 101–111)
CO2: 23 mmol/L (ref 22–32)
Creatinine, Ser: 1.45 mg/dL — ABNORMAL HIGH (ref 0.44–1.00)
GFR calc Af Amer: 41 mL/min — ABNORMAL LOW (ref >60)
GFR calc non Af Amer: 36 mL/min — ABNORMAL LOW (ref >60)
GLUCOSE: 106 mg/dL — AB (ref 65–99)
Potassium: 3.3 mmol/L — ABNORMAL LOW (ref 3.5–5.1)
Sodium: 142 mmol/L (ref 135–145)

## 2015-06-09 LAB — TROPONIN I: Troponin I: <0.03 ng/mL (ref <0.031)

## 2015-06-09 NOTE — ED Provider Notes (Signed)
CSN: [id number]     Arrival date & time 06/09/15  1520 History  By signing my name below, I, Holly Nixon, attest that this documentation has been prepared under the direction and in the presence of Blair Lions, PA-C.  Electronically Signed: Irene Nixon, ED Scribe. 06/09/2015. 6:04 PM.   Chief Complaint  Patient presents with  . Hypertension   The history is provided by the patient. No language interpreter was used.  HPI Comments: Holly Nixon is a 70 y.o. Female with a hx of hypercholesteremia, myelofibrosis, environmental allergies, and sleep apnea who presents to the Emergency Department complaining of increased BP onset PTA. Pt states that she was seen by an allergist today and was told that she was hypertensive. She reports lethargy, abdominal distention, and SOB over the past month. She states that her pulse has also been running high and that any time she eats, her abdomen "swells." She reports that she read about myelofibrosis and that it could potentially cause swelling to the liver and spleen. Pt is concerned that this is causing the bilateral posterior hip pain she has been having. Pt takes estrogen.  She denies a hx of DM, headache, chest pain, abdominal pain, nausea, vomiting, leg swelling, leg pain, blurred vision, or dizziness. Pt states that she she has an appointment with her PCP next week. Pt denies hx of smoking.   Past Medical History  Diagnosis Date  . Depression   . Hypercholesterolemia   . Environmental allergies   . Sleep apnea     has cpap, not currently using   Past Surgical History  Procedure Laterality Date  . Abdominal hysterectomy      total  . Tubal ligation    . Colonoscopy     Family History  Problem Relation Age of Onset  . Colon cancer Mother    Social History  Substance Use Topics  . Smoking status: Former Smoker    Quit date: 03/05/1978  . Smokeless tobacco: Never Used  . Alcohol Use: 1.2 oz/week    2 Shots of liquor per week    OB History    No data available     Review of Systems  Constitutional: Positive for fatigue.  Eyes: Negative for visual disturbance.  Respiratory: Positive for shortness of breath.   Cardiovascular: Negative for chest pain and leg swelling.  Gastrointestinal: Positive for abdominal distention. Negative for nausea, vomiting and abdominal pain.  Musculoskeletal: Positive for arthralgias.  Neurological: Negative for dizziness and headaches.  All other systems reviewed and are negative.   Allergies  Aspirin; Nsaids; Penicillins; and Sulfonamide derivatives  Home Medications   Prior to Admission medications   Medication Sig Start Date End Date Taking? Authorizing Provider  buPROPion (ZYBAN) 150 MG 12 hr tablet Take 150 mg by mouth daily.    Historical Provider, MD  Cholecalciferol (VITAMIN D) 2000 UNITS tablet Take 2,000 Units by mouth daily.    Historical Provider, MD  citalopram (CELEXA) 20 MG tablet Take 20 mg by mouth daily.    Historical Provider, MD  EPIPEN 2-PAK 0.3 MG/0.3ML SOAJ injection  03/20/13   Historical Provider, MD  estradiol (ESTRACE) 0.5 MG tablet Take 0.5 mg by mouth daily.    Historical Provider, MD  levocetirizine (XYZAL) 5 MG tablet Take 5 mg by mouth every evening.    Historical Provider, MD  Omega-3 Krill Oil 500 MG CAPS Take by mouth.    Historical Provider, MD  simvastatin (ZOCOR) 20 MG tablet Take 20 mg by  mouth at bedtime.    Historical Provider, MD   BP 161/85 mmHg  Pulse 102  Temp(Src) 97.9 F (36.6 C) (Oral)  Resp 16  SpO2 100% Physical Exam  Constitutional: She is oriented to person, place, and time. She appears well-developed and well-nourished.  HENT:  Head: Normocephalic and atraumatic.  Eyes: EOM are normal. Pupils are equal, round, and reactive to light.  Neck: Normal range of motion. Neck supple.  Cardiovascular: Regular rhythm and normal heart sounds.  Tachycardia present.  Exam reveals no gallop and no friction rub.   No murmur  heard. Pulmonary/Chest: Effort normal and breath sounds normal. She has no wheezes.  Abdominal: Soft. There is no tenderness.  Musculoskeletal: Normal range of motion.  Neurological: She is alert and oriented to person, place, and time.  Skin: Skin is warm and dry.  Psychiatric: She has a normal mood and affect. Her behavior is normal.  Nursing note and vitals reviewed.   ED Course  Procedures  DIAGNOSTIC STUDIES: Oxygen Saturation is 100% on RA, normal by my interpretation.    COORDINATION OF CARE: 5:54 PM-Discussed treatment plan which includes labs, EKG, and x-ray with pt at bedside and pt agreed to plan.    Labs Review Labs Reviewed  BASIC METABOLIC PANEL - Abnormal; Notable for the following:    Potassium 3.3 (*)    Glucose, Bld 106 (*)    BUN 22 (*)    Creatinine, Ser 1.45 (*)    GFR calc non Af Amer 36 (*)    GFR calc Af Amer 41 (*)    All other components within normal limits  CBC  TROPONIN I  D-DIMER, QUANTITATIVE (NOT AT [facility])    Imaging Review Dg Chest 2 View  06/09/2015  CLINICAL DATA:  Shortness of breath for 1 month EXAM: CHEST  2 VIEW COMPARISON:  10/30/2006 FINDINGS: Cardiomediastinal silhouette is stable. No acute infiltrate or pleural effusion. No pulmonary edema. Mild degenerative changes mid and lower thoracic spine. IMPRESSION: No active cardiopulmonary disease. Electronically Signed   By: Lahoma Crocker M.D.   On: 06/09/2015 18:14   I have personally reviewed and evaluated these images and lab results as part of my medical decision-making.   EKG Interpretation   Date/Time:  Thursday June 09 2015 15:25:25 EDT Ventricular Rate:  100 PR Interval:  190 QRS Duration: 84 QT Interval:  354 QTC Calculation: 456 R Axis:   75 Text Interpretation:  Normal sinus rhythm Nonspecific ST abnormality  Abnormal ECG No old tracing to compare Confirmed by GOLDSTON  MD, SCOTT  ([id number]) on 06/09/2015 6:06:35 PM      MDM   Final diagnoses:  Essential hypertension   Shortness of breath   Patient with multiple complaints but her acute concerns are SOB and hypertension.  D-dimer, CBC, troponin, CXR unremarkable for acute change.  EKG demonstrates nonspecific ST abnormality.  BMP demonstrates SCr of 1.45. Discussed results with patient, who is aware of decreased kidney function from previous PCP workup.   Patient is to be discharged with recommendation to follow up with PCP in regards to today's hospital visit. SOB is not likely of cardiac or pulmonary etiology d/t presentation, d-dimer negative, VSS, no tracheal deviation, no JVD or new murmur, RRR, breath sounds equal bilaterally. Negative troponin, and negative CXR. Pt has been advised to return to the ED if SOB becomes exertional, associated with CP, diaphoresis or nausea, new/worsening symptoms. Pt appears reliable for follow up and is agreeable to discharge.  Case has been  discussed with Dr. Regenia Skeeter who agrees with the above plan to discharge.    I personally performed the services described in this documentation, which was scribed in my presence. The recorded information has been reviewed and is accurate.   [place] Lions, PA-C 06/16/15 1511  Sherwood Gambler, MD 06/21/15 1014

## 2015-06-09 NOTE — Discharge Instructions (Signed)
Ms. Holly Nixon,  Nice meeting you! Please follow-up with your primary care provider (and cardiology as needed) regarding your elevated blood pressure and the nonspecific ST changes on your EKG. Return to the emergency department if you develop chest pain, increasing shortness of breath, new/worsening symptoms. Feel better soon!  S. Wendie Simmer, PA-C

## 2015-06-09 NOTE — ED Notes (Signed)
Pt seen at her allergist today & was told she was hypertensive, pt reports feeling lethargic over the last month, pt denies HA, blurred vision & dizziness, pt denies CP, pt c/o SOB onset x 1 mth, A&O x4

## 2015-06-09 NOTE — ED Notes (Signed)
This Rn spoke with Regenia Skeeter, MD re: the pt complaint & presentation, acuity changed to [facility] 4 for pt to be evaluated in POD F

## 2015-06-10 ENCOUNTER — Ambulatory Visit (INDEPENDENT_AMBULATORY_CARE_PROVIDER_SITE_OTHER): Payer: Medicare Other | Admitting: Cardiology

## 2015-06-10 ENCOUNTER — Encounter: Payer: Self-pay | Admitting: Cardiology

## 2015-06-10 VITALS — BP 140/76 | HR 92 | Ht 63.0 in | Wt 159.5 lb

## 2015-06-10 DIAGNOSIS — J309 Allergic rhinitis, unspecified: Secondary | ICD-10-CM | POA: Diagnosis not present

## 2015-06-10 DIAGNOSIS — I10 Essential (primary) hypertension: Secondary | ICD-10-CM | POA: Diagnosis not present

## 2015-06-10 DIAGNOSIS — R5383 Other fatigue: Secondary | ICD-10-CM | POA: Diagnosis not present

## 2015-06-10 LAB — TSH: TSH: 1.83 m[IU]/L

## 2015-06-10 NOTE — Patient Instructions (Signed)
Your physician recommends that you schedule a follow-up appointment in: As Needed  Your physician recommends that you return for lab work in: TSH

## 2015-06-10 NOTE — Progress Notes (Signed)
Cardiology Office Note   Date:  06/10/2015   ID:  Holly Nixon, DOB 26-Feb-1946, MRN [id number]  PCP:  Leonides Sake, MD  Cardiologist:   Minus Breeding, MD   Chief Complaint  Patient presents with  . Abnormal ECG      History of Present Illness: Holly Nixon is a 70 y.o. female who presents for evaluation of hypertension. She was at her allergist yesterday and was noted to be quite hypertensive. Readings were 150s over 100. She was sent to the emergency room surprisingly. I do see that her blood pressure was [id number] systolic peak there although her diastolic and come down. There are no significant abnormalities. Blood work was unremarkable and she was sent home. She came here to have further evaluation. Her blood pressure is slightly elevated today but at home her readings were fine with systolics in the [id number]. She's not had prior cardiac history. She's had no prior cardiac testing. She is active in her garden but she doesn't exercise routinely. She's been very fatigued recently and hasn't wanted to do as much activity. She actually came off some antidepressants about 3 months ago wondering if this could be related but she's actually no better and perhaps worse. She doesn't describe shortness of breath, PND or orthopnea. She doesn't have any typical chest pressure, neck or arm discomfort. She's not describing palpitations, presyncope or syncope. She's had some bony pain and was worried that this could be myelofibrosis that she had in the past. Labs however have been unremarkable.    Past Medical History  Diagnosis Date  . Depression   . Hypercholesterolemia   . Environmental allergies   . Sleep apnea     Has CPAP    Past Surgical History  Procedure Laterality Date  . Abdominal hysterectomy      total  . Tubal ligation    . Colonoscopy       Current Outpatient Prescriptions  Medication Sig Dispense Refill  . Cholecalciferol (VITAMIN D) 2000 UNITS tablet Take 2,000 Units  by mouth daily.    Marland Kitchen EPIPEN 2-PAK 0.3 MG/0.3ML SOAJ injection     . estradiol (ESTRACE) 0.5 MG tablet Take 0.5 mg by mouth daily.    Marland Kitchen levocetirizine (XYZAL) 5 MG tablet Take 5 mg by mouth every evening.    . Omega-3 Krill Oil 500 MG CAPS Take by mouth.    . simvastatin (ZOCOR) 20 MG tablet Take 20 mg by mouth at bedtime.     No current facility-administered medications for this visit.    Allergies:   Aspirin; Nsaids; Penicillins; and Sulfonamide derivatives    Social History:  The patient  reports that she quit smoking about 37 years ago. She has never used smokeless tobacco. She reports that she drinks about 1.2 oz of alcohol per week. She reports that she does not use illicit drugs.   Family History:  The patient's family history includes Colon cancer in her mother.    ROS:  Please see the history of present illness.   Otherwise, review of systems are positive for none.   All other systems are reviewed and negative.    PHYSICAL EXAM: VS:  BP 140/76 mmHg  Pulse 92  Ht 5\' 3"  (1.6 m)  Wt 159 lb 8 oz (72.349 kg)  BMI 28.26 kg/m2 , BMI Body mass index is 28.26 kg/(m^2). GENERAL:  Well appearing HEENT:  Pupils equal round and reactive, fundi not visualized, oral mucosa unremarkable NECK:  No jugular venous  distention, waveform within normal limits, carotid upstroke brisk and symmetric, no bruits, no thyromegaly LYMPHATICS:  No cervical, inguinal adenopathy LUNGS:  Clear to auscultation bilaterally BACK:  No CVA tenderness CHEST:  Unremarkable HEART:  PMI not displaced or sustained,S1 and S2 within normal limits, no S3, no S4, no clicks, no rubs, no murmurs ABD:  Flat, positive bowel sounds normal in frequency in pitch, no bruits, no rebound, no guarding, no midline pulsatile mass, no hepatomegaly, no splenomegaly EXT:  2 plus pulses throughout, no edema, no cyanosis no clubbing SKIN:  No rashes no nodules NEURO:  Cranial nerves II through XII grossly intact, motor grossly intact  throughout PSYCH:  Cognitively intact, oriented to person place and time    EKG:  EKG is not ordered today. The ekg ordered 06/09/15 sinus tachycardia, rate 100, axis within normal limits, intervals within normal limits, RSR prime V1, no acute ST-T wave changes.   Recent Labs: 06/09/2015: BUN 22*; Creatinine, Ser 1.45*; Hemoglobin 13.7; Platelets 224; Potassium 3.3*; Sodium 142    Lipid Panel No results found for: CHOL, TRIG, HDL, CHOLHDL, VLDL, LDLCALC, LDLDIRECT    Wt Readings from Last 3 Encounters:  06/10/15 159 lb 8 oz (72.349 kg)  05/20/13 130 lb (58.968 kg)  05/06/13 140 lb (63.504 kg)      Other studies Reviewed: Additional studies/ records that were reviewed today include: ED records. Review of the above records demonstrates:  Please see elsewhere in the note.     ASSESSMENT AND PLAN:  HTN:  I suspect her blood pressure is higher than she knows. She does have some renal is which is interestingly and could be related to blood pressure issues. Given her strict instructions on how to keep a blood pressure diary and she will send this to me and I will make further suggestions about medications. In the meantime we talked about salt restriction and in particular talked about exercise.  FATIGUE:  I'm going to check a TSH. I asked her to talk to her primary provider about depression although she doesn't feel particularly depressed. We talked about increasing her activity as well.   Current medicines are reviewed at length with the patient today.  The patient does not have concerns regarding medicines.  The following changes have been made:  no change  Labs/ tests ordered today include:   Orders Placed This Encounter  Procedures  . TSH     Disposition:   FU with me as needed.      Signed, Minus Breeding, MD  06/10/2015 2:50 PM    [facility]

## 2015-06-12 DIAGNOSIS — I10 Essential (primary) hypertension: Secondary | ICD-10-CM

## 2015-06-12 HISTORY — DX: Essential (primary) hypertension: I10

## 2015-06-13 DIAGNOSIS — D7581 Myelofibrosis: Secondary | ICD-10-CM | POA: Diagnosis not present

## 2015-06-13 DIAGNOSIS — N183 Chronic kidney disease, stage 3 (moderate): Secondary | ICD-10-CM | POA: Diagnosis not present

## 2015-06-13 DIAGNOSIS — R7303 Prediabetes: Secondary | ICD-10-CM | POA: Diagnosis not present

## 2015-06-13 DIAGNOSIS — G4700 Insomnia, unspecified: Secondary | ICD-10-CM | POA: Diagnosis not present

## 2015-06-13 DIAGNOSIS — M533 Sacrococcygeal disorders, not elsewhere classified: Secondary | ICD-10-CM | POA: Diagnosis not present

## 2015-06-13 DIAGNOSIS — Z1389 Encounter for screening for other disorder: Secondary | ICD-10-CM | POA: Diagnosis not present

## 2015-06-13 DIAGNOSIS — F418 Other specified anxiety disorders: Secondary | ICD-10-CM | POA: Diagnosis not present

## 2015-06-13 DIAGNOSIS — E782 Mixed hyperlipidemia: Secondary | ICD-10-CM | POA: Diagnosis not present

## 2015-06-17 ENCOUNTER — Ambulatory Visit
Admission: RE | Admit: 2015-06-17 | Discharge: 2015-06-17 | Disposition: A | Discharge status: Home / Self Care | Payer: Medicare Other | Source: Ambulatory Visit | Attending: Family Medicine | Admitting: Family Medicine

## 2015-06-17 ENCOUNTER — Other Ambulatory Visit: Payer: Self-pay | Admitting: Family Medicine

## 2015-06-17 DIAGNOSIS — M533 Sacrococcygeal disorders, not elsewhere classified: Secondary | ICD-10-CM | POA: Diagnosis not present

## 2015-06-24 ENCOUNTER — Encounter: Payer: Self-pay | Admitting: Cardiology

## 2015-06-24 DIAGNOSIS — J309 Allergic rhinitis, unspecified: Secondary | ICD-10-CM | POA: Diagnosis not present

## 2015-06-29 DIAGNOSIS — R5382 Chronic fatigue, unspecified: Secondary | ICD-10-CM | POA: Diagnosis not present

## 2015-06-29 DIAGNOSIS — G4733 Obstructive sleep apnea (adult) (pediatric): Secondary | ICD-10-CM

## 2015-06-29 DIAGNOSIS — R5383 Other fatigue: Secondary | ICD-10-CM

## 2015-06-29 HISTORY — DX: Other fatigue: R53.83

## 2015-06-29 HISTORY — DX: Obstructive sleep apnea (adult) (pediatric): G47.33

## 2015-07-12 DIAGNOSIS — G478 Other sleep disorders: Secondary | ICD-10-CM | POA: Diagnosis not present

## 2015-07-15 DIAGNOSIS — J45909 Unspecified asthma, uncomplicated: Secondary | ICD-10-CM | POA: Diagnosis not present

## 2015-08-03 DIAGNOSIS — R5383 Other fatigue: Secondary | ICD-10-CM | POA: Diagnosis not present

## 2015-08-12 DIAGNOSIS — J309 Allergic rhinitis, unspecified: Secondary | ICD-10-CM | POA: Diagnosis not present

## 2015-08-24 DIAGNOSIS — Z6828 Body mass index (BMI) 28.0-28.9, adult: Secondary | ICD-10-CM | POA: Diagnosis not present

## 2015-08-24 DIAGNOSIS — J309 Allergic rhinitis, unspecified: Secondary | ICD-10-CM | POA: Diagnosis not present

## 2015-08-24 DIAGNOSIS — A499 Bacterial infection, unspecified: Secondary | ICD-10-CM | POA: Diagnosis not present

## 2015-09-09 DIAGNOSIS — J309 Allergic rhinitis, unspecified: Secondary | ICD-10-CM | POA: Diagnosis not present

## 2015-10-07 DIAGNOSIS — J309 Allergic rhinitis, unspecified: Secondary | ICD-10-CM | POA: Diagnosis not present

## 2015-10-18 DIAGNOSIS — L710 Perioral dermatitis: Secondary | ICD-10-CM | POA: Diagnosis not present

## 2015-11-03 DIAGNOSIS — J309 Allergic rhinitis, unspecified: Secondary | ICD-10-CM | POA: Diagnosis not present

## 2015-11-29 DIAGNOSIS — J309 Allergic rhinitis, unspecified: Secondary | ICD-10-CM | POA: Diagnosis not present

## 2015-12-12 DIAGNOSIS — Z23 Encounter for immunization: Secondary | ICD-10-CM | POA: Diagnosis not present

## 2016-01-02 DIAGNOSIS — J309 Allergic rhinitis, unspecified: Secondary | ICD-10-CM | POA: Diagnosis not present

## 2016-01-04 DIAGNOSIS — N183 Chronic kidney disease, stage 3 (moderate): Secondary | ICD-10-CM | POA: Diagnosis not present

## 2016-01-04 DIAGNOSIS — E782 Mixed hyperlipidemia: Secondary | ICD-10-CM | POA: Diagnosis not present

## 2016-01-06 DIAGNOSIS — Z6826 Body mass index (BMI) 26.0-26.9, adult: Secondary | ICD-10-CM | POA: Diagnosis not present

## 2016-01-06 DIAGNOSIS — Z1231 Encounter for screening mammogram for malignant neoplasm of breast: Secondary | ICD-10-CM | POA: Diagnosis not present

## 2016-01-06 DIAGNOSIS — E782 Mixed hyperlipidemia: Secondary | ICD-10-CM | POA: Diagnosis not present

## 2016-01-06 DIAGNOSIS — H25813 Combined forms of age-related cataract, bilateral: Secondary | ICD-10-CM | POA: Diagnosis not present

## 2016-01-06 DIAGNOSIS — N183 Chronic kidney disease, stage 3 (moderate): Secondary | ICD-10-CM | POA: Diagnosis not present

## 2016-01-06 DIAGNOSIS — H04123 Dry eye syndrome of bilateral lacrimal glands: Secondary | ICD-10-CM | POA: Diagnosis not present

## 2016-01-06 DIAGNOSIS — H524 Presbyopia: Secondary | ICD-10-CM | POA: Diagnosis not present

## 2016-01-06 DIAGNOSIS — H01001 Unspecified blepharitis right upper eyelid: Secondary | ICD-10-CM | POA: Diagnosis not present

## 2016-01-06 DIAGNOSIS — Z0189 Encounter for other specified special examinations: Secondary | ICD-10-CM | POA: Diagnosis not present

## 2016-01-06 DIAGNOSIS — Z23 Encounter for immunization: Secondary | ICD-10-CM | POA: Diagnosis not present

## 2016-02-03 DIAGNOSIS — J301 Allergic rhinitis due to pollen: Secondary | ICD-10-CM | POA: Diagnosis not present

## 2016-02-03 DIAGNOSIS — J3089 Other allergic rhinitis: Secondary | ICD-10-CM | POA: Diagnosis not present

## 2016-06-21 DIAGNOSIS — J301 Allergic rhinitis due to pollen: Secondary | ICD-10-CM | POA: Diagnosis not present

## 2016-06-21 DIAGNOSIS — R0602 Shortness of breath: Secondary | ICD-10-CM | POA: Diagnosis not present

## 2016-06-21 DIAGNOSIS — J3089 Other allergic rhinitis: Secondary | ICD-10-CM | POA: Diagnosis not present

## 2016-06-21 DIAGNOSIS — J3081 Allergic rhinitis due to animal (cat) (dog) hair and dander: Secondary | ICD-10-CM | POA: Diagnosis not present

## 2016-08-01 DIAGNOSIS — Z1159 Encounter for screening for other viral diseases: Secondary | ICD-10-CM | POA: Diagnosis not present

## 2016-08-01 DIAGNOSIS — E782 Mixed hyperlipidemia: Secondary | ICD-10-CM | POA: Diagnosis not present

## 2016-08-01 DIAGNOSIS — N183 Chronic kidney disease, stage 3 (moderate): Secondary | ICD-10-CM | POA: Diagnosis not present

## 2016-08-03 DIAGNOSIS — G4700 Insomnia, unspecified: Secondary | ICD-10-CM | POA: Diagnosis not present

## 2016-08-03 DIAGNOSIS — Z6825 Body mass index (BMI) 25.0-25.9, adult: Secondary | ICD-10-CM | POA: Diagnosis not present

## 2016-08-03 DIAGNOSIS — R946 Abnormal results of thyroid function studies: Secondary | ICD-10-CM | POA: Diagnosis not present

## 2016-08-03 DIAGNOSIS — F418 Other specified anxiety disorders: Secondary | ICD-10-CM | POA: Diagnosis not present

## 2016-08-03 DIAGNOSIS — E782 Mixed hyperlipidemia: Secondary | ICD-10-CM | POA: Diagnosis not present

## 2016-08-03 DIAGNOSIS — Z1389 Encounter for screening for other disorder: Secondary | ICD-10-CM | POA: Diagnosis not present

## 2016-08-03 DIAGNOSIS — D7581 Myelofibrosis: Secondary | ICD-10-CM | POA: Diagnosis not present

## 2016-08-03 DIAGNOSIS — Z23 Encounter for immunization: Secondary | ICD-10-CM | POA: Diagnosis not present

## 2016-08-03 DIAGNOSIS — E663 Overweight: Secondary | ICD-10-CM | POA: Diagnosis not present

## 2016-08-03 DIAGNOSIS — Z9181 History of falling: Secondary | ICD-10-CM | POA: Diagnosis not present

## 2016-09-10 DIAGNOSIS — Z0000 Encounter for general adult medical examination without abnormal findings: Secondary | ICD-10-CM | POA: Diagnosis not present

## 2016-09-10 DIAGNOSIS — Z9181 History of falling: Secondary | ICD-10-CM | POA: Diagnosis not present

## 2016-09-10 DIAGNOSIS — Z136 Encounter for screening for cardiovascular disorders: Secondary | ICD-10-CM | POA: Diagnosis not present

## 2016-09-10 DIAGNOSIS — E785 Hyperlipidemia, unspecified: Secondary | ICD-10-CM | POA: Diagnosis not present

## 2016-09-10 DIAGNOSIS — Z1389 Encounter for screening for other disorder: Secondary | ICD-10-CM | POA: Diagnosis not present

## 2016-10-01 ENCOUNTER — Other Ambulatory Visit: Payer: Self-pay

## 2016-11-26 DIAGNOSIS — Z23 Encounter for immunization: Secondary | ICD-10-CM | POA: Diagnosis not present

## 2016-12-04 DIAGNOSIS — L565 Disseminated superficial actinic porokeratosis (DSAP): Secondary | ICD-10-CM | POA: Diagnosis not present

## 2016-12-04 DIAGNOSIS — L918 Other hypertrophic disorders of the skin: Secondary | ICD-10-CM | POA: Diagnosis not present

## 2016-12-04 DIAGNOSIS — L905 Scar conditions and fibrosis of skin: Secondary | ICD-10-CM | POA: Diagnosis not present

## 2016-12-04 DIAGNOSIS — L821 Other seborrheic keratosis: Secondary | ICD-10-CM | POA: Diagnosis not present

## 2016-12-04 DIAGNOSIS — L720 Epidermal cyst: Secondary | ICD-10-CM | POA: Diagnosis not present

## 2017-06-19 ENCOUNTER — Ambulatory Visit (INDEPENDENT_AMBULATORY_CARE_PROVIDER_SITE_OTHER): Payer: Medicare Other

## 2017-06-19 ENCOUNTER — Ambulatory Visit (INDEPENDENT_AMBULATORY_CARE_PROVIDER_SITE_OTHER): Payer: Medicare Other | Admitting: Podiatry

## 2017-06-19 ENCOUNTER — Encounter: Payer: Self-pay | Admitting: Podiatry

## 2017-06-19 ENCOUNTER — Other Ambulatory Visit: Payer: Self-pay | Admitting: Podiatry

## 2017-06-19 DIAGNOSIS — B351 Tinea unguium: Secondary | ICD-10-CM

## 2017-06-19 DIAGNOSIS — M779 Enthesopathy, unspecified: Secondary | ICD-10-CM

## 2017-06-19 NOTE — Progress Notes (Signed)
Subjective:   Patient ID: Holly Nixon, female   DOB: 72 y.o.   MRN: [id number]   HPI Patient presents stating the tendinitis is doing well and the orthotics have been very beneficial for her and that she does have some nail disease   ROS      Objective:  Physical Exam  Neurovascular status intact with mild tenderness symptomatology localized with moderate nail disease is present     Assessment:  Chronic tendinitis controlled well by orthotics along with nail disease     Plan:  H&P condition reviewed recommended long-term orthotics and scan for customized orthotics devices today and do not recommend treatment for nail disease,  X-rays indicate moderate depression of the arch spur formation with no indication of arthritis or stress fracture

## 2017-06-29 IMAGING — CR DG CHEST 2V
2 series · 2 of 2 positions shown · non-contrast
Comparison: 10/30/2006

CLINICAL DATA: Shortness of breath for 1 month

EXAM:
CHEST  2 VIEW

[chest pa]
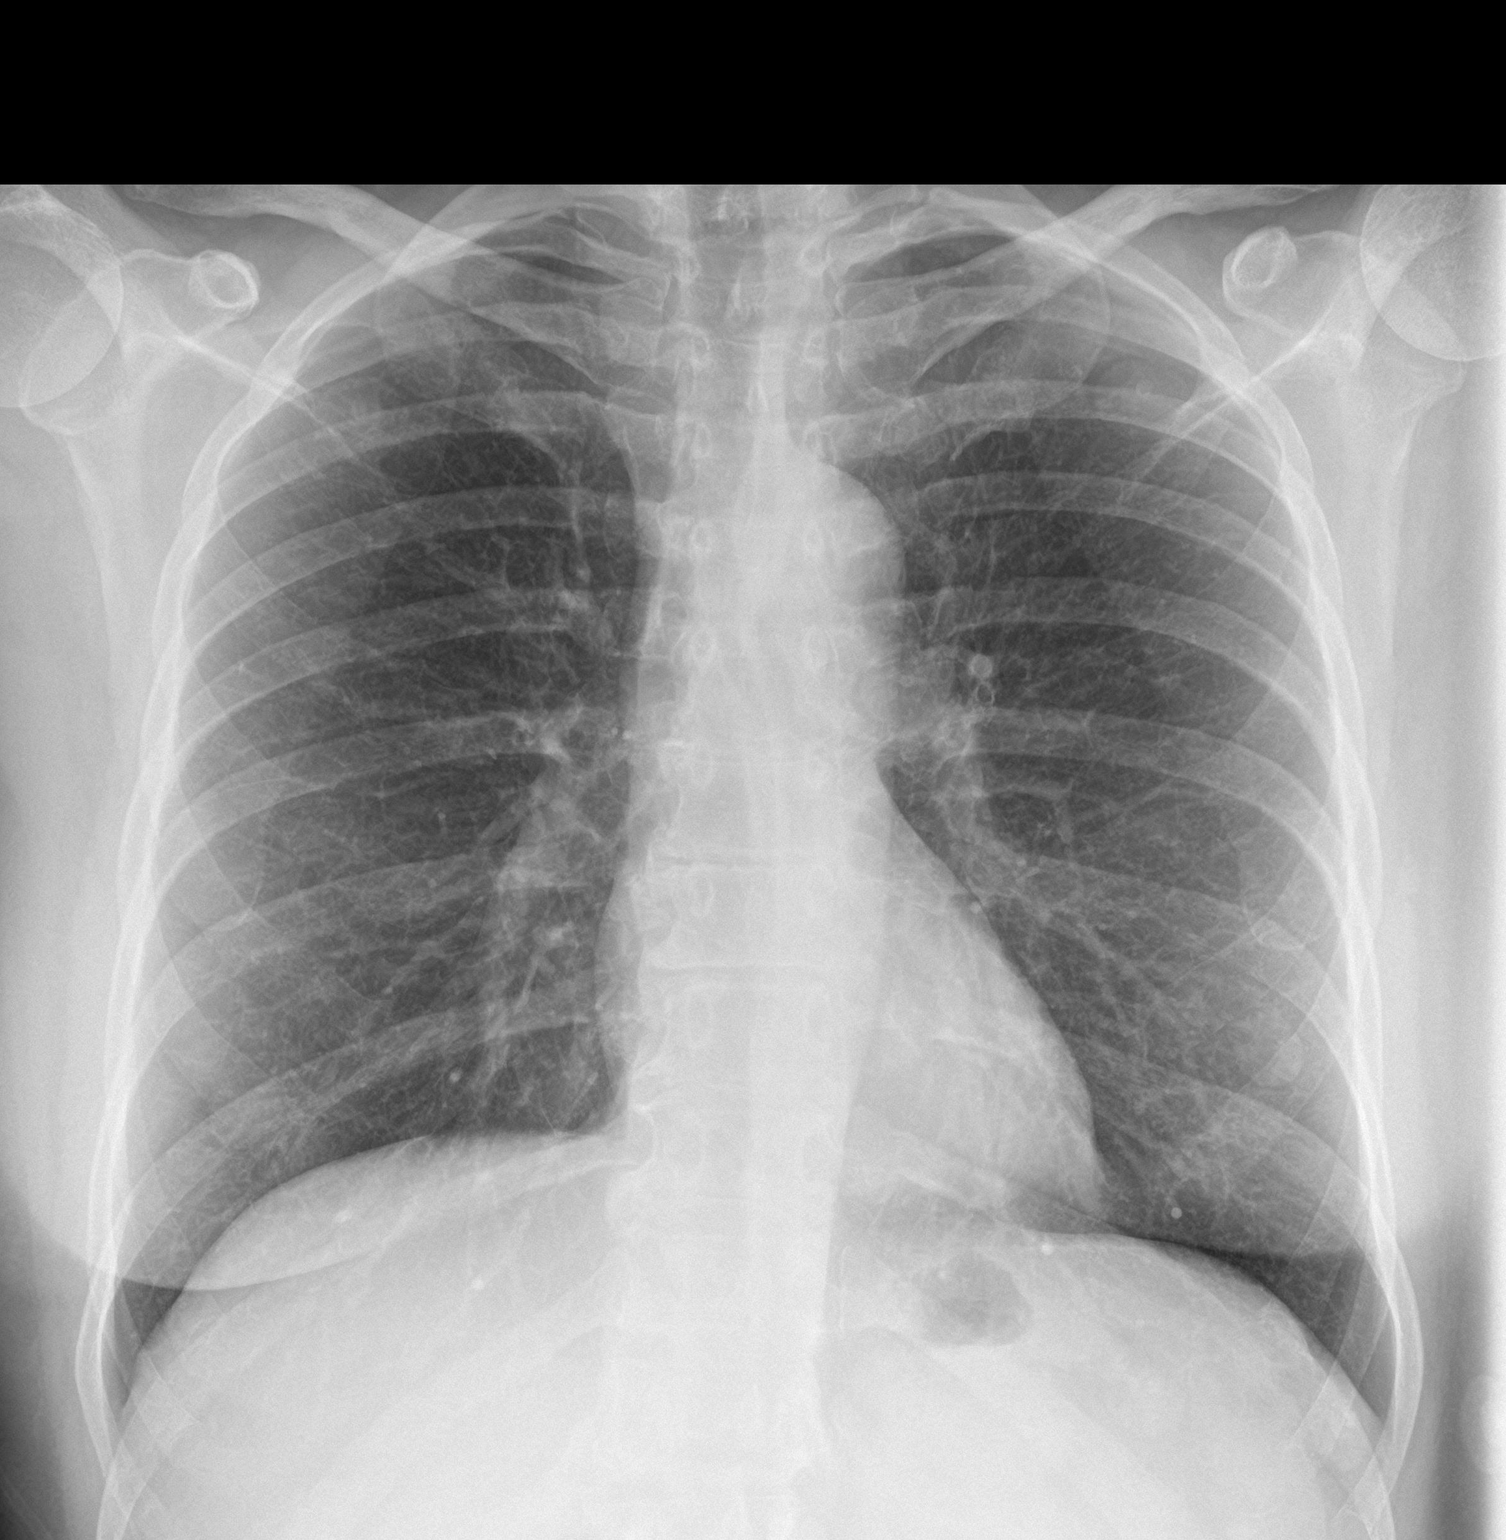

[chest lat]
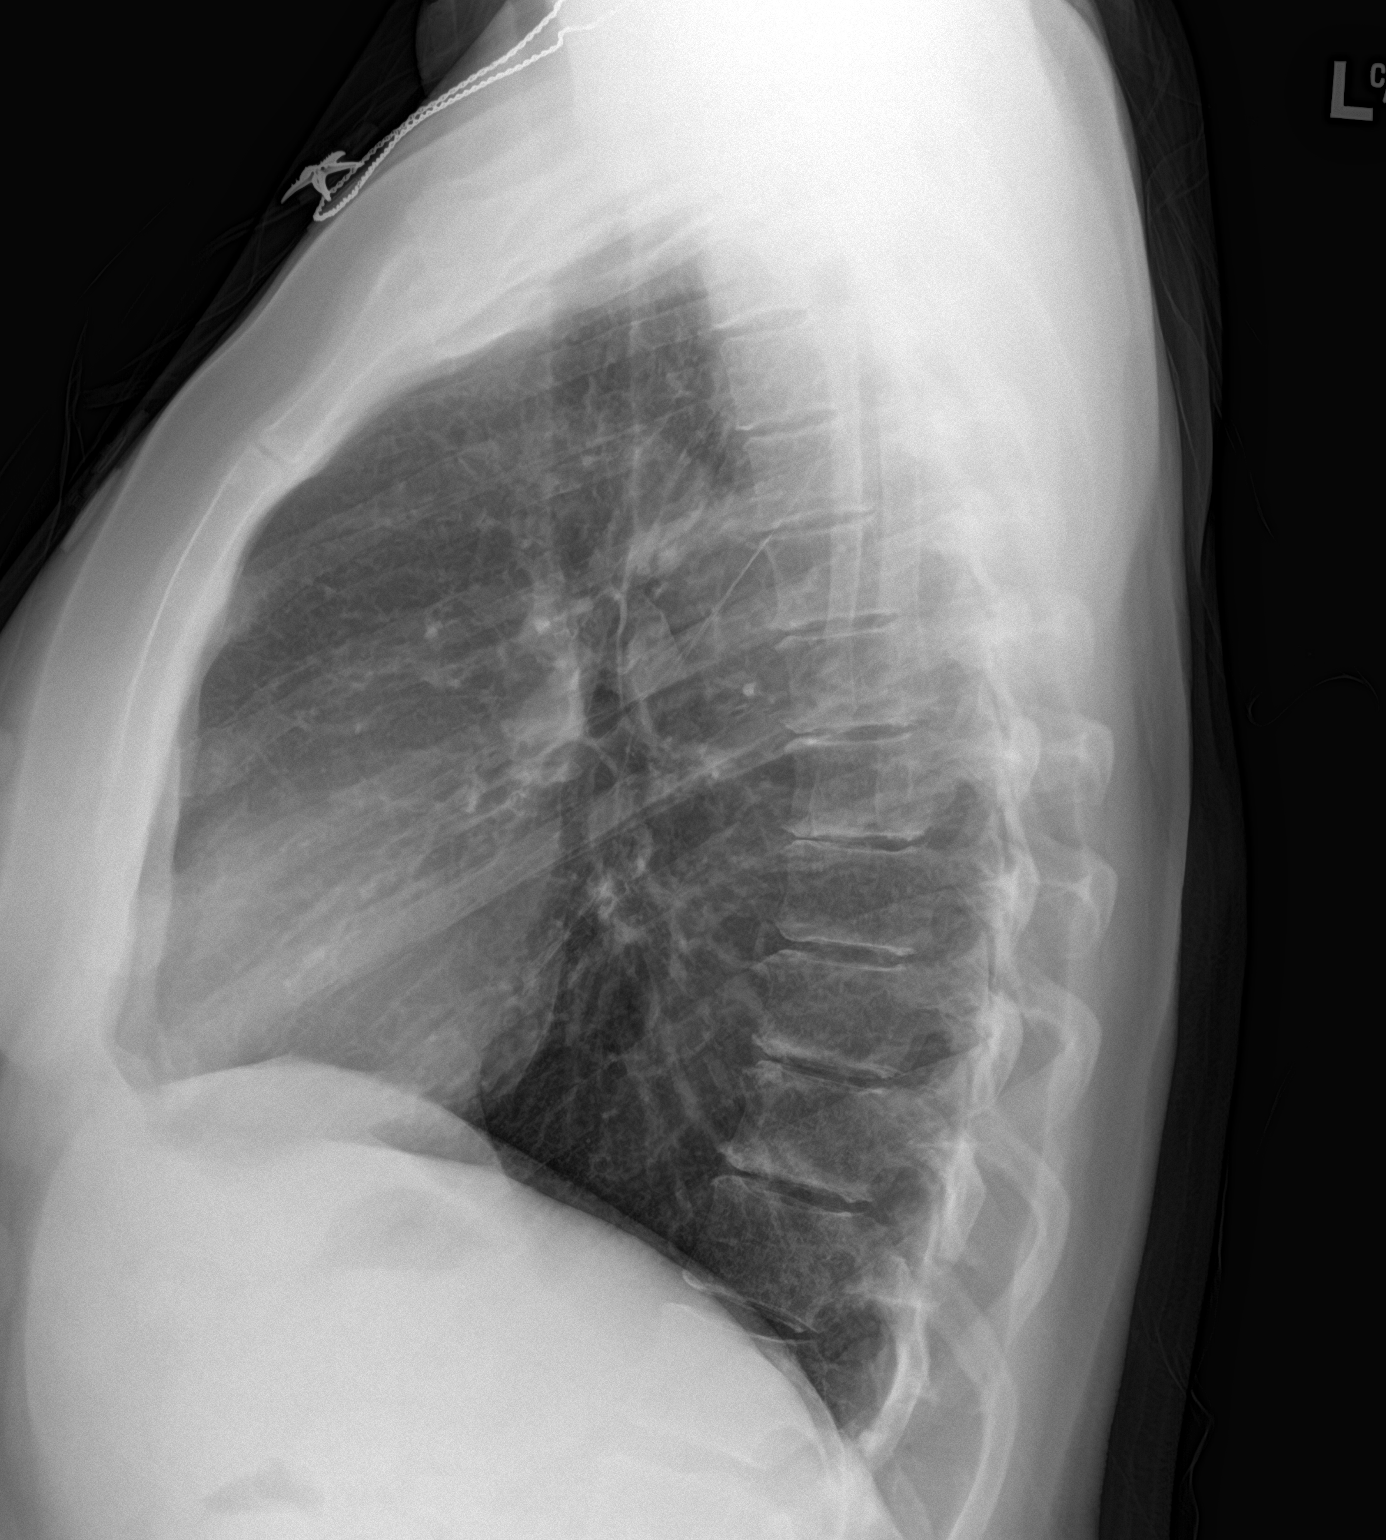

[2 of 2 positions shown; findings below may reference images not displayed]

FINDINGS: Cardiomediastinal silhouette is stable. No acute infiltrate or
pleural effusion. No pulmonary edema. Mild degenerative changes mid
and lower thoracic spine.
IMPRESSION: No active cardiopulmonary disease.

## 2017-07-07 IMAGING — CR DG SACRUM/COCCYX 2+V
3 series · 3 of 3 positions shown · non-contrast
Comparison: None.

CLINICAL DATA: Chronic pain.  No recent injury

EXAM:
SACRUM AND COCCYX - 2+ VIEW

[view not recorded (1 of 3)]
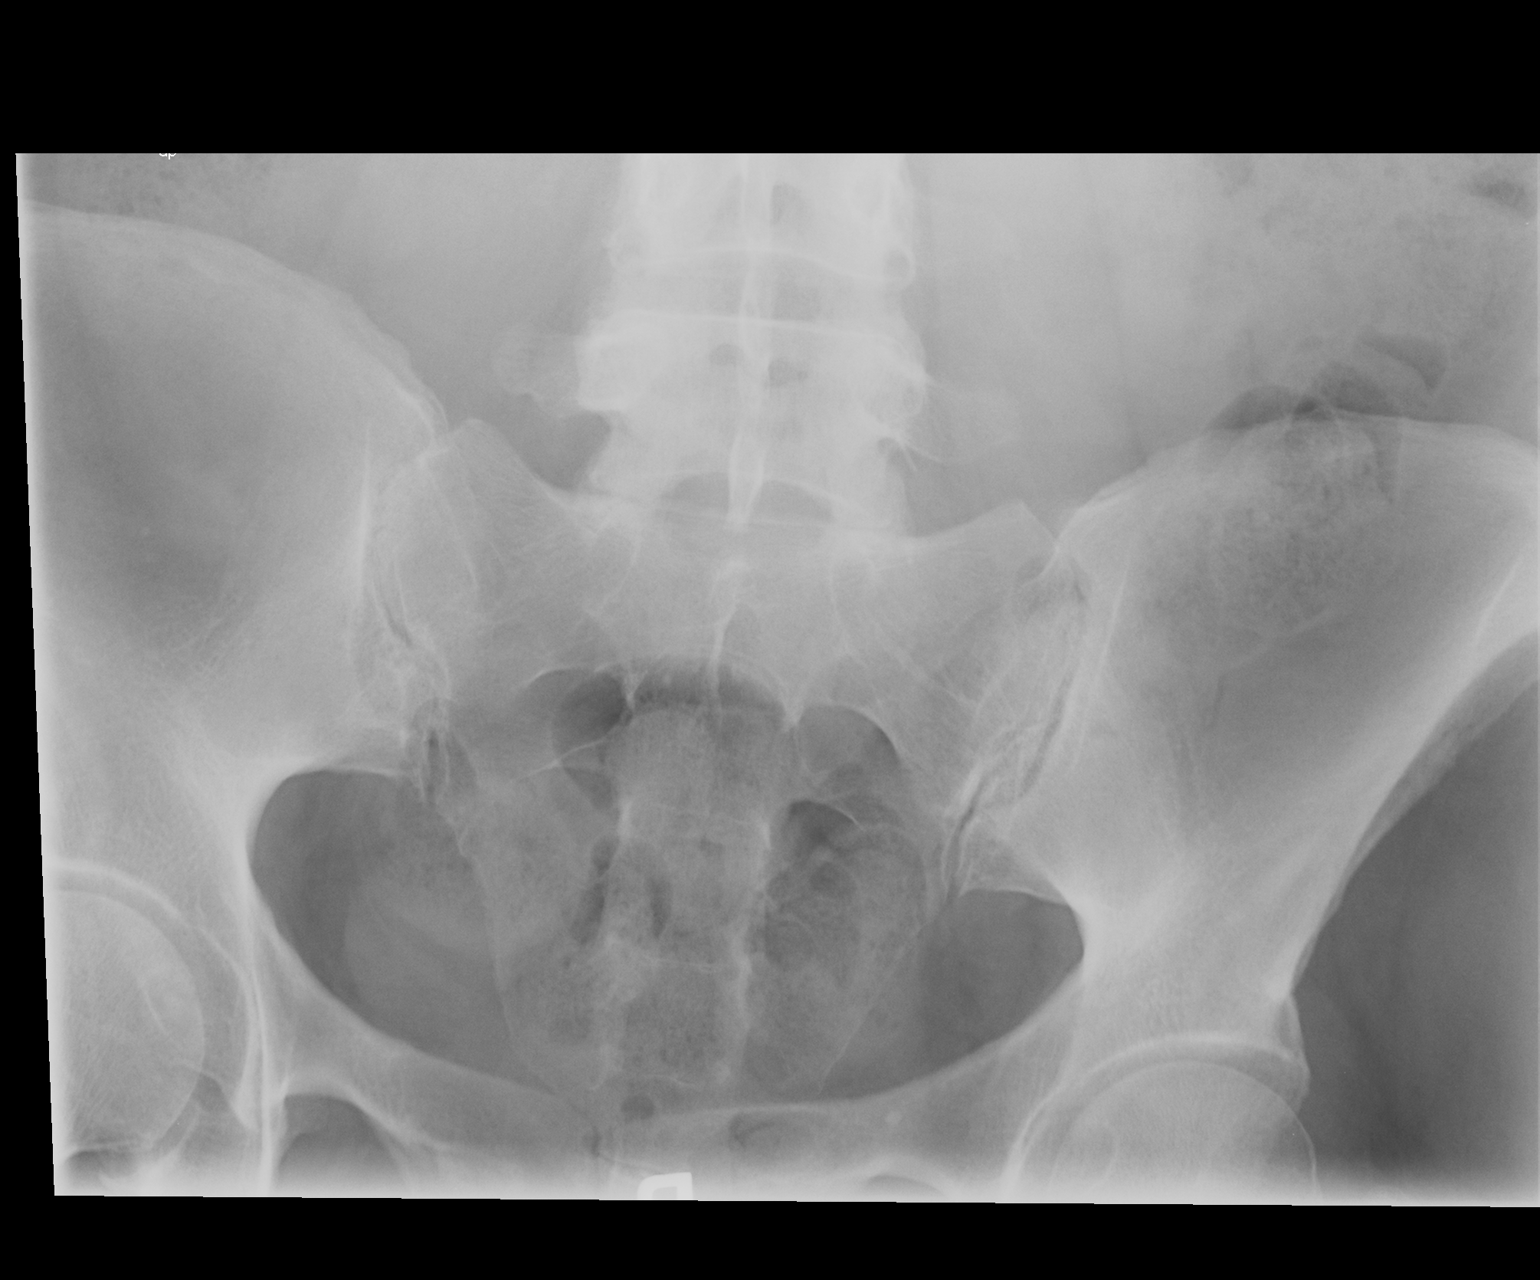

[view not recorded (2 of 3)]
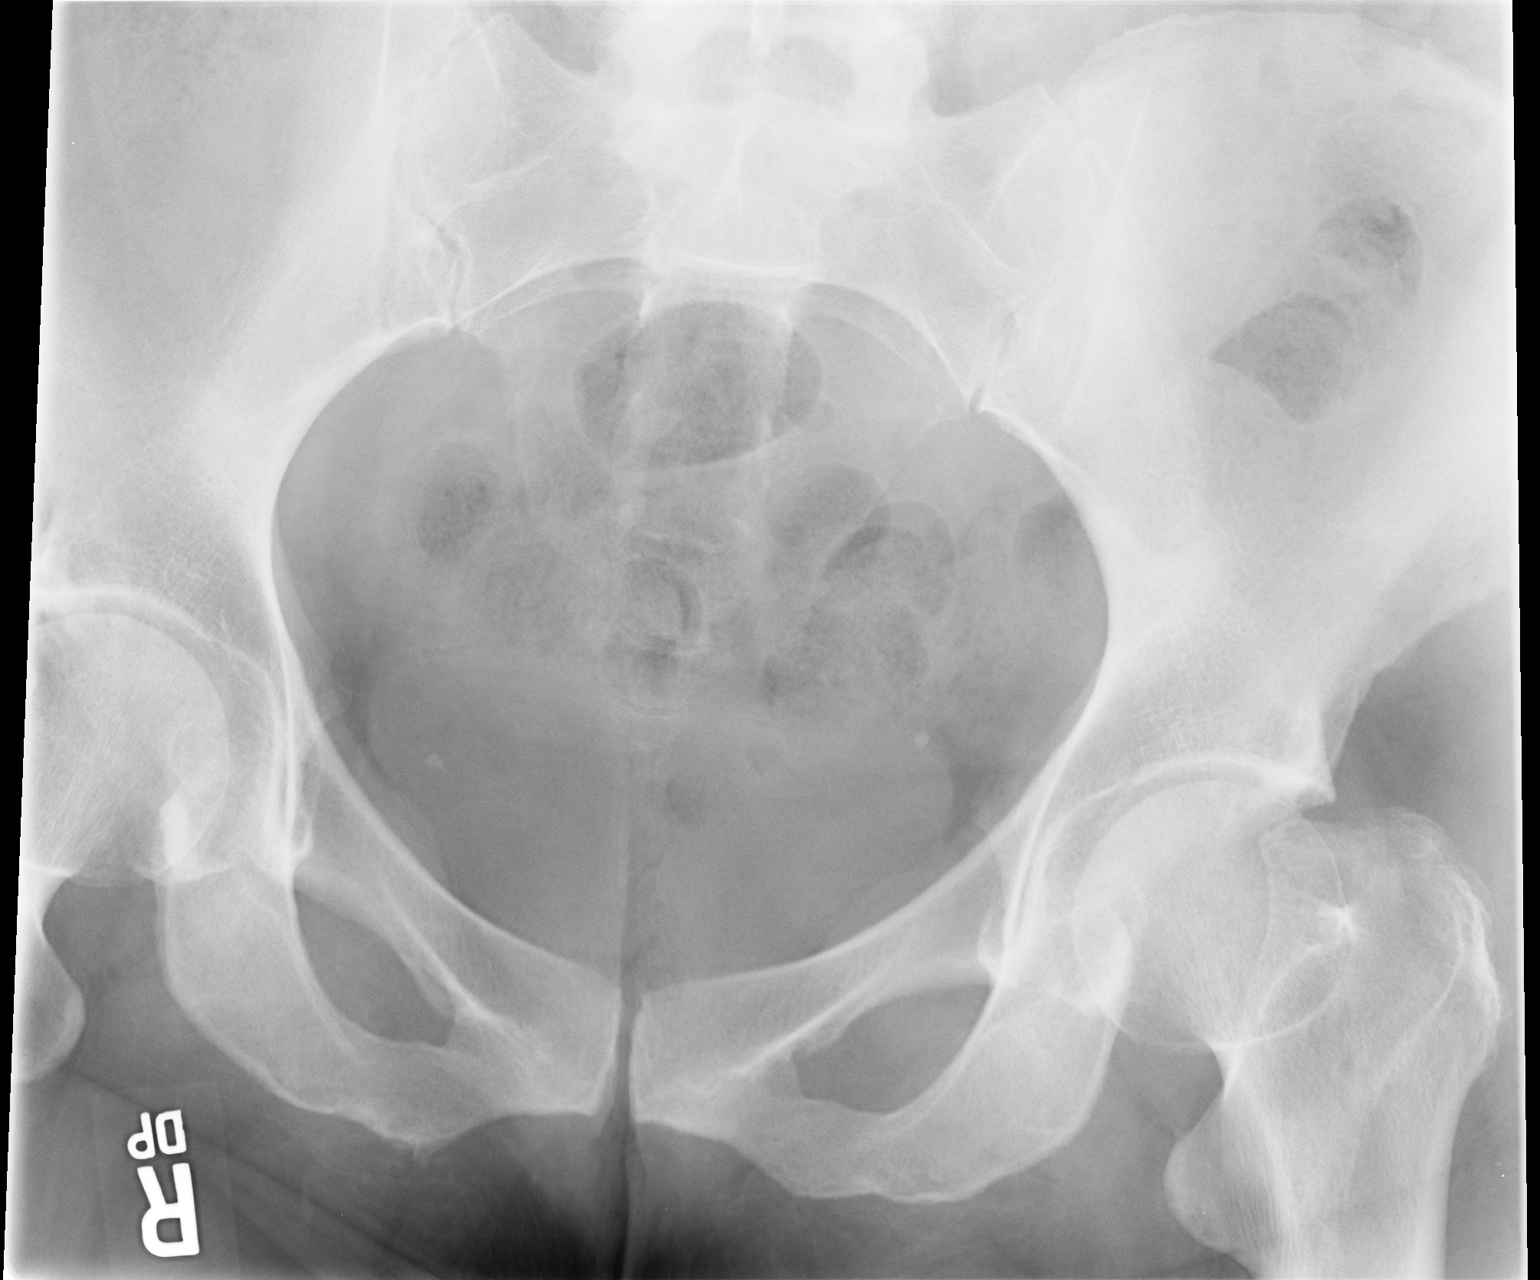

[view not recorded (3 of 3)]
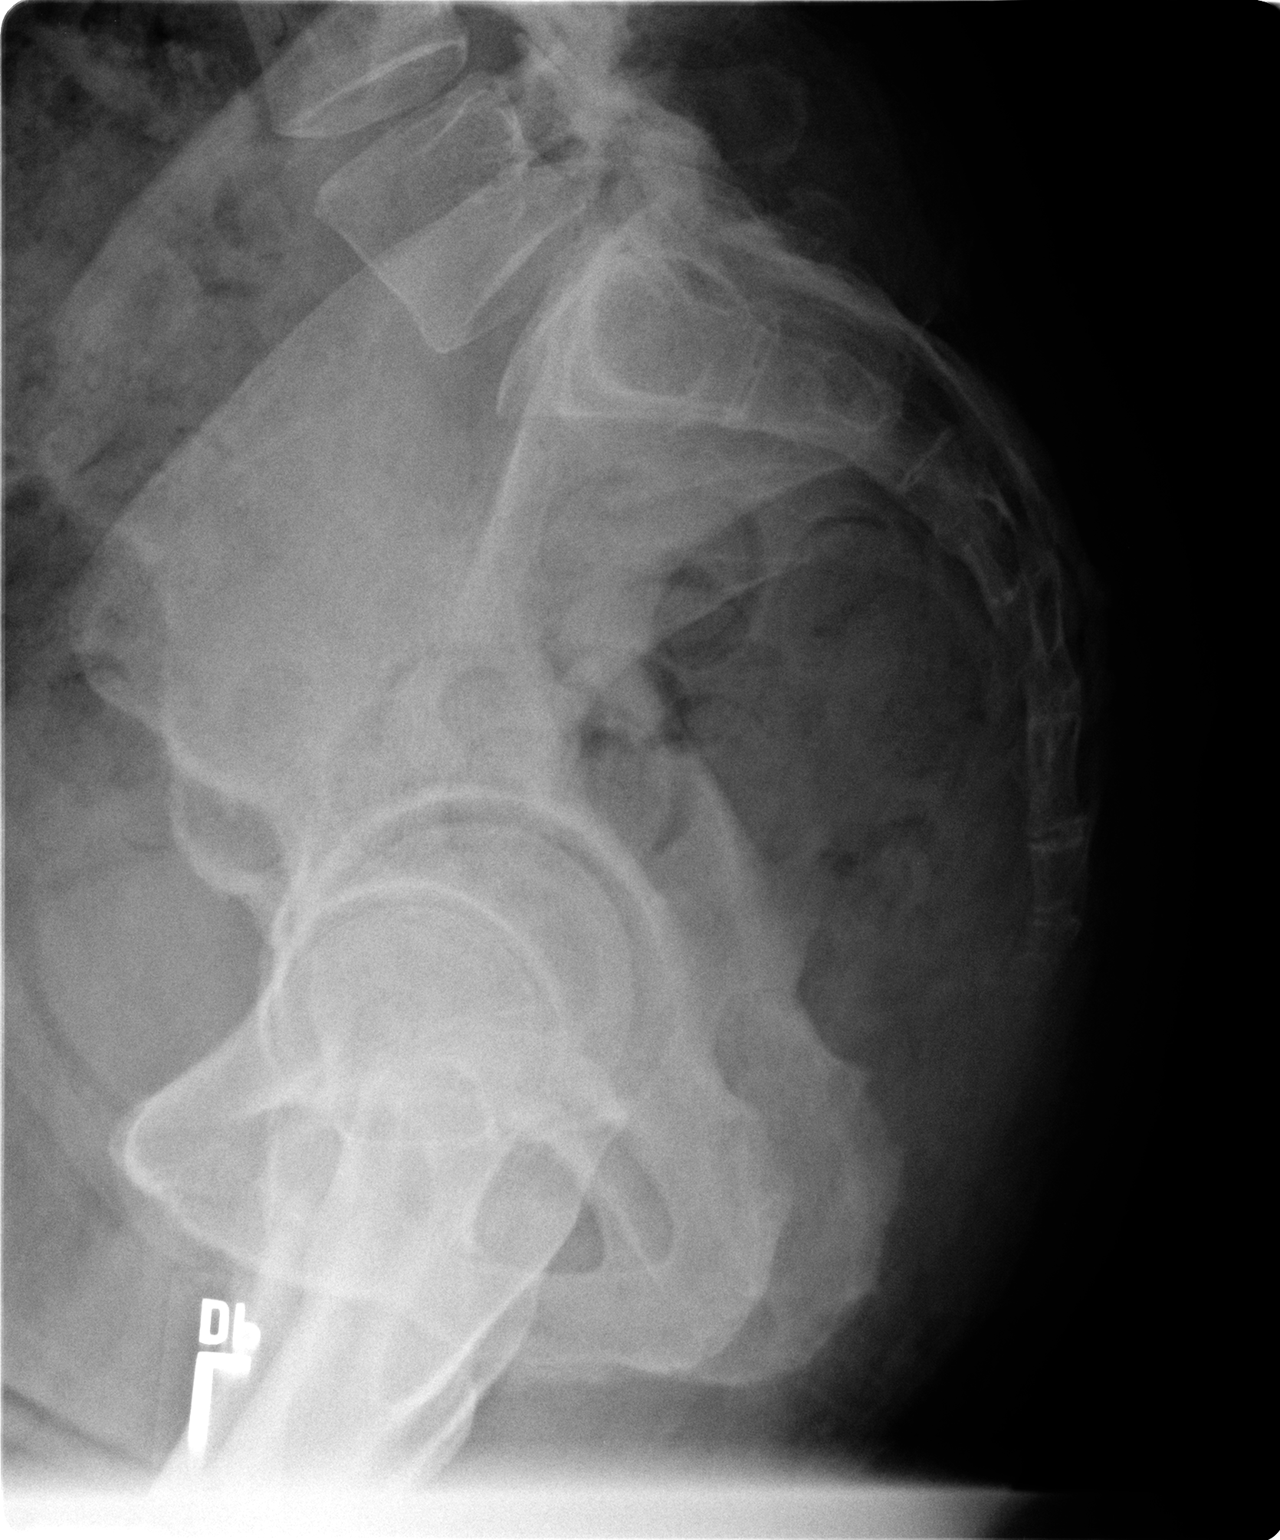

[3 of 3 positions shown; findings below may reference images not displayed]

FINDINGS: Frontal, tunnel frontal, and lateral views obtained. There is no
fracture or diastases. There is osteoarthritic change in the
sacroiliac joints bilaterally. No sacroiliitis.
IMPRESSION: Negative. Osteoarthritic change in sacroiliac joints. No fracture or
diastases.

## 2017-07-15 ENCOUNTER — Ambulatory Visit (INDEPENDENT_AMBULATORY_CARE_PROVIDER_SITE_OTHER): Payer: Medicare Other | Admitting: Orthotics

## 2017-07-15 DIAGNOSIS — M779 Enthesopathy, unspecified: Secondary | ICD-10-CM

## 2017-07-15 DIAGNOSIS — I10 Essential (primary) hypertension: Secondary | ICD-10-CM

## 2017-07-15 NOTE — Progress Notes (Signed)
Patient came in today to pick up custom made foot orthotics.  The goals were accomplished and the patient reported no dissatisfaction with said orthotics.  Patient was advised of breakin period and how to report any issues. 

## 2018-07-10 ENCOUNTER — Encounter: Payer: Self-pay | Admitting: Gastroenterology

## 2018-07-29 ENCOUNTER — Encounter: Payer: Self-pay | Admitting: *Deleted

## 2018-08-08 ENCOUNTER — Other Ambulatory Visit: Payer: Self-pay

## 2018-08-08 ENCOUNTER — Ambulatory Visit (AMBULATORY_SURGERY_CENTER): Payer: Medicare Other | Admitting: *Deleted

## 2018-08-08 VITALS — Ht 63.0 in | Wt 146.0 lb

## 2018-08-08 DIAGNOSIS — Z800 Family history of malignant neoplasm of digestive organs: Secondary | ICD-10-CM

## 2018-08-08 MED ORDER — NA SULFATE-K SULFATE-MG SULF 17.5-3.13-1.6 GM/177ML PO SOLN: 1.0000 | Freq: Once | ORAL | 0 refills | Status: AC

## 2018-08-08 NOTE — Progress Notes (Signed)

## 2018-08-21 ENCOUNTER — Telehealth: Payer: Self-pay | Admitting: Gastroenterology

## 2018-08-21 NOTE — Telephone Encounter (Signed)

## 2018-08-22 ENCOUNTER — Ambulatory Visit (AMBULATORY_SURGERY_CENTER): Payer: Medicare Other | Admitting: Gastroenterology

## 2018-08-22 ENCOUNTER — Other Ambulatory Visit: Payer: Self-pay

## 2018-08-22 ENCOUNTER — Encounter: Payer: Self-pay | Admitting: Gastroenterology

## 2018-08-22 VITALS — BP 115/67 | HR 71 | Temp 97.8°F | Resp 12 | Ht 63.0 in | Wt 146.0 lb

## 2018-08-22 DIAGNOSIS — Z1211 Encounter for screening for malignant neoplasm of colon: Secondary | ICD-10-CM | POA: Diagnosis not present

## 2018-08-22 DIAGNOSIS — Z800 Family history of malignant neoplasm of digestive organs: Secondary | ICD-10-CM | POA: Diagnosis not present

## 2018-08-22 DIAGNOSIS — D122 Benign neoplasm of ascending colon: Secondary | ICD-10-CM

## 2018-08-22 DIAGNOSIS — K635 Polyp of colon: Secondary | ICD-10-CM

## 2018-08-22 MED ORDER — SODIUM CHLORIDE 0.9 % IV SOLN: 500.0000 mL | Freq: Once | INTRAVENOUS | Status: DC

## 2018-08-22 NOTE — Patient Instructions (Signed)
YOU HAD AN ENDOSCOPIC PROCEDURE TODAY AT [facility]:   Refer to the procedure report that was given to you for any specific questions about what was found during the examination.  If the procedure report does not answer your questions, please call your gastroenterologist to clarify.  If you requested that your care partner not be given the details of your procedure findings, then the procedure report has been included in a sealed envelope for you to review at your convenience later.  YOU SHOULD EXPECT: Some feelings of bloating in the abdomen. Passage of more gas than usual.  Walking can help get rid of the air that was put into your GI tract during the procedure and reduce the bloating. If you had a lower endoscopy (such as a colonoscopy or flexible sigmoidoscopy) you may notice spotting of blood in your stool or on the toilet paper. If you underwent a bowel prep for your procedure, you may not have a normal bowel movement for a few days.  Please Note:  You might notice some irritation and congestion in your nose or some drainage.  This is from the oxygen used during your procedure.  There is no need for concern and it should clear up in a day or so.  SYMPTOMS TO REPORT IMMEDIATELY:   Following lower endoscopy (colonoscopy or flexible sigmoidoscopy):  Excessive amounts of blood in the stool  Significant tenderness or worsening of abdominal pains  Swelling of the abdomen that is new, acute  Fever of 100F or higher    For urgent or emergent issues, a gastroenterologist can be reached at any hour by calling [phone].   DIET:  We do recommend a small meal at first, but then you may proceed to your regular diet.  Drink plenty of fluids but you should avoid alcoholic beverages for 24 hours.  ACTIVITY:  You should plan to take it easy for the rest of today and you should NOT DRIVE or use heavy machinery until tomorrow (because of the sedation medicines used during the test).     FOLLOW UP: Our staff will call the number listed on your records 48-72 hours following your procedure to check on you and address any questions or concerns that you may have regarding the information given to you following your procedure. If we do not reach you, we will leave a message.  We will attempt to reach you two times.  During this call, we will ask if you have developed any symptoms of COVID 19. If you develop any symptoms (ie: fever, flu-like symptoms, shortness of breath, cough etc.) before then, please call [phone].  If you test positive for Covid 19 in the 2 weeks post procedure, please call and report this information to [place].    If any biopsies were taken you will be contacted by phone or by letter within the next 1-3 weeks.  Please call us at [phone] if you have not heard about the biopsies in 3 weeks.   Await for biopsy results Polyps (handout given) Hemorrhoids (handout given) Diverticulosis (handout given)  SIGNATURES/CONFIDENTIALITY: You and/or your care partner have signed paperwork which will be entered into your electronic medical record.  These signatures attest to the fact that that the information above on your After Visit Summary has been reviewed and is understood.  Full responsibility of the confidentiality of this discharge information lies with you and/or your care-partner.

## 2018-08-22 NOTE — Progress Notes (Signed)
Called to room to assist during endoscopic procedure.  Patient ID and intended procedure confirmed with present staff. Received instructions for my participation in the procedure from the performing physician.  

## 2018-08-22 NOTE — Progress Notes (Signed)
VS taken by Riki Sheer LPN  Pt states she has a herpes outbreak around her rectum

## 2018-08-22 NOTE — Op Note (Signed)
[place] Patient Name: Holly Nixon Procedure Date: 08/22/2018 8:13 AM MRN: [id number] Endoscopist: Mauri Pole , MD Age: 73 Referring MD:  Date of Birth: 12/12/1945 Gender: Female Account #: [id number] Procedure:                Colonoscopy Indications:              Screening in patient at increased risk: Family                            history of 1st-degree relative with colorectal                            cancer Medicines:                Monitored Anesthesia Care Procedure:                Pre-Anesthesia Assessment:                           - Prior to the procedure, a History and Physical                            was performed, and patient medications and                            allergies were reviewed. The patient's tolerance of                            previous anesthesia was also reviewed. The risks                            and benefits of the procedure and the sedation                            options and risks were discussed with the patient.                            All questions were answered, and informed consent                            was obtained. Prior Anticoagulants: The patient has                            taken no previous anticoagulant or antiplatelet                            agents. ASA Grade Assessment: II - A patient with                            mild systemic disease. After reviewing the risks                            and benefits, the patient was deemed in  satisfactory condition to undergo the procedure.                           After obtaining informed consent, the colonoscope                            was passed under direct vision. Throughout the                            procedure, the patient's blood pressure, pulse, and                            oxygen saturations were monitored continuously. The                            Colonoscope was introduced through the anus and                          advanced to the the cecum, identified by                            appendiceal orifice and ileocecal valve. The                            colonoscopy was performed without difficulty. The                            patient tolerated the procedure well. The quality                            of the bowel preparation was good. The ileocecal                            valve, appendiceal orifice, and rectum were                            photographed. Scope In: 8:19:48 AM Scope Out: 8:41:07 AM Scope Withdrawal Time: 0 hours 9 minutes 56 seconds  Total Procedure Duration: 0 hours 21 minutes 19 seconds  Findings:                 The perianal and digital rectal examinations were                            normal.                           A 1 mm polyp was found in the ascending colon. The                            polyp was sessile. The polyp was removed with a                            cold biopsy forceps. Resection and retrieval were  complete.                           A few small-mouthed diverticula were found in the                            sigmoid colon.                           Non-bleeding internal hemorrhoids were found during                            retroflexion. The hemorrhoids were small. Complications:            No immediate complications. Estimated Blood Loss:     Estimated blood loss was minimal. Impression:               - One 1 mm polyp in the ascending colon, removed                            with a cold biopsy forceps. Resected and retrieved.                           - Diverticulosis in the sigmoid colon.                           - Non-bleeding internal hemorrhoids. Recommendation:           - Patient has a contact number available for                            emergencies. The signs and symptoms of potential                            delayed complications were discussed with the                             patient. Return to normal activities tomorrow.                            Written discharge instructions were provided to the                            patient.                           - Resume previous diet.                           - Continue present medications.                           - Await pathology results.                           - Repeat colonoscopy in 5 years for surveillance  based on pathology results. Mauri Pole, MD 08/22/2018 8:45:53 AM This report has been signed electronically.

## 2018-08-22 NOTE — Progress Notes (Signed)
Report to PACU, RN, vss, BBS= Clear.  

## 2018-08-26 ENCOUNTER — Telehealth: Payer: Self-pay

## 2018-08-26 NOTE — Telephone Encounter (Signed)
  Follow up Call-  Call back number 08/22/2018  Post procedure Call Back phone  # [phone]  Permission to leave phone message Yes  Some recent data might be hidden     Patient questions:   Do you have a fever, pain , or abdominal swelling? No. Pain Score  0 *  Have you tolerated food without any problems? Yes.    Have you been able to return to your normal activities? Yes.    Do you have any questions about your discharge instructions: Diet   No. Medications  No. Follow up visit  No.  Do you have questions or concerns about your Care? No.  Actions: * If pain score is 4 or above: No action needed, pain <4.   1. Have you developed a fever since your procedure? no  2.   Have you had an respiratory symptoms (SOB or cough) since your procedure? no  3.   Have you tested positive for COVID 19 since your procedure no  4.   Have you had any family members/close contacts diagnosed with the COVID 19 since your procedure?  no   If yes to any of these questions please route to Joylene John, RN and Alphonsa Gin, Therapist, sports.

## 2018-08-28 ENCOUNTER — Telehealth: Payer: Self-pay | Admitting: Gastroenterology

## 2018-08-28 NOTE — Telephone Encounter (Signed)
Pt requested a return call to discuss results of colonoscopy.

## 2018-08-28 NOTE — Telephone Encounter (Signed)
Left message on her voicemail. Results are not yet reviewed by the provider. She normally mails a [employer]. It should be ready soon. I will send her message to the provider.

## 2018-09-04 ENCOUNTER — Encounter: Payer: Self-pay | Admitting: Gastroenterology

## 2018-10-30 ENCOUNTER — Other Ambulatory Visit: Payer: Self-pay | Admitting: Obstetrics and Gynecology

## 2018-10-30 DIAGNOSIS — E2839 Other primary ovarian failure: Secondary | ICD-10-CM

## 2018-11-12 ENCOUNTER — Other Ambulatory Visit: Payer: Self-pay

## 2018-11-12 ENCOUNTER — Ambulatory Visit
Admission: RE | Admit: 2018-11-12 | Discharge: 2018-11-12 | Disposition: A | Discharge status: Home / Self Care | Payer: Medicare Other | Source: Ambulatory Visit | Attending: Obstetrics and Gynecology | Admitting: Obstetrics and Gynecology

## 2018-11-12 DIAGNOSIS — E2839 Other primary ovarian failure: Secondary | ICD-10-CM

## 2019-07-14 DIAGNOSIS — H903 Sensorineural hearing loss, bilateral: Secondary | ICD-10-CM | POA: Insufficient documentation

## 2019-07-14 HISTORY — DX: Sensorineural hearing loss, bilateral: H90.3

## 2020-07-01 DIAGNOSIS — E782 Mixed hyperlipidemia: Secondary | ICD-10-CM | POA: Diagnosis not present

## 2020-07-01 DIAGNOSIS — N183 Chronic kidney disease, stage 3 unspecified: Secondary | ICD-10-CM | POA: Diagnosis not present

## 2020-07-11 DIAGNOSIS — Z6825 Body mass index (BMI) 25.0-25.9, adult: Secondary | ICD-10-CM | POA: Diagnosis not present

## 2020-07-11 DIAGNOSIS — N183 Chronic kidney disease, stage 3 unspecified: Secondary | ICD-10-CM | POA: Diagnosis not present

## 2020-07-11 DIAGNOSIS — D7581 Myelofibrosis: Secondary | ICD-10-CM | POA: Diagnosis not present

## 2020-07-11 DIAGNOSIS — R0789 Other chest pain: Secondary | ICD-10-CM | POA: Diagnosis not present

## 2020-07-11 DIAGNOSIS — R0689 Other abnormalities of breathing: Secondary | ICD-10-CM | POA: Diagnosis not present

## 2020-07-11 DIAGNOSIS — E782 Mixed hyperlipidemia: Secondary | ICD-10-CM | POA: Diagnosis not present

## 2020-07-11 DIAGNOSIS — F418 Other specified anxiety disorders: Secondary | ICD-10-CM | POA: Diagnosis not present

## 2020-07-12 DIAGNOSIS — J301 Allergic rhinitis due to pollen: Secondary | ICD-10-CM | POA: Diagnosis not present

## 2020-07-12 DIAGNOSIS — J3081 Allergic rhinitis due to animal (cat) (dog) hair and dander: Secondary | ICD-10-CM | POA: Diagnosis not present

## 2020-07-12 DIAGNOSIS — H1045 Other chronic allergic conjunctivitis: Secondary | ICD-10-CM | POA: Diagnosis not present

## 2020-07-12 DIAGNOSIS — J3089 Other allergic rhinitis: Secondary | ICD-10-CM | POA: Diagnosis not present

## 2020-07-26 DIAGNOSIS — H2513 Age-related nuclear cataract, bilateral: Secondary | ICD-10-CM | POA: Diagnosis not present

## 2020-07-26 DIAGNOSIS — Z1231 Encounter for screening mammogram for malignant neoplasm of breast: Secondary | ICD-10-CM | POA: Diagnosis not present

## 2020-07-26 DIAGNOSIS — H52203 Unspecified astigmatism, bilateral: Secondary | ICD-10-CM | POA: Diagnosis not present

## 2020-07-26 DIAGNOSIS — H35363 Drusen (degenerative) of macula, bilateral: Secondary | ICD-10-CM | POA: Diagnosis not present

## 2020-08-11 DIAGNOSIS — N1831 Chronic kidney disease, stage 3a: Secondary | ICD-10-CM | POA: Diagnosis not present

## 2020-08-11 DIAGNOSIS — Z6826 Body mass index (BMI) 26.0-26.9, adult: Secondary | ICD-10-CM | POA: Diagnosis not present

## 2020-08-11 DIAGNOSIS — I10 Essential (primary) hypertension: Secondary | ICD-10-CM | POA: Diagnosis not present

## 2020-08-29 ENCOUNTER — Encounter: Payer: Self-pay | Admitting: *Deleted

## 2020-08-29 ENCOUNTER — Encounter: Payer: Self-pay | Admitting: Cardiology

## 2020-08-29 DIAGNOSIS — N63 Unspecified lump in unspecified breast: Secondary | ICD-10-CM | POA: Insufficient documentation

## 2020-08-29 HISTORY — DX: Unspecified lump in unspecified breast: N63.0

## 2020-09-12 DIAGNOSIS — I10 Essential (primary) hypertension: Secondary | ICD-10-CM | POA: Diagnosis not present

## 2020-09-12 DIAGNOSIS — Z79899 Other long term (current) drug therapy: Secondary | ICD-10-CM | POA: Diagnosis not present

## 2020-09-12 DIAGNOSIS — K5909 Other constipation: Secondary | ICD-10-CM | POA: Diagnosis not present

## 2020-09-12 DIAGNOSIS — Z6827 Body mass index (BMI) 27.0-27.9, adult: Secondary | ICD-10-CM | POA: Diagnosis not present

## 2020-09-26 DIAGNOSIS — Z9109 Other allergy status, other than to drugs and biological substances: Secondary | ICD-10-CM | POA: Insufficient documentation

## 2020-09-26 DIAGNOSIS — F419 Anxiety disorder, unspecified: Secondary | ICD-10-CM | POA: Insufficient documentation

## 2020-09-26 DIAGNOSIS — T7840XA Allergy, unspecified, initial encounter: Secondary | ICD-10-CM | POA: Insufficient documentation

## 2020-09-26 DIAGNOSIS — N189 Chronic kidney disease, unspecified: Secondary | ICD-10-CM | POA: Insufficient documentation

## 2020-09-26 DIAGNOSIS — N816 Rectocele: Secondary | ICD-10-CM | POA: Insufficient documentation

## 2020-09-26 DIAGNOSIS — H409 Unspecified glaucoma: Secondary | ICD-10-CM | POA: Insufficient documentation

## 2020-09-26 DIAGNOSIS — J45909 Unspecified asthma, uncomplicated: Secondary | ICD-10-CM | POA: Insufficient documentation

## 2020-09-26 DIAGNOSIS — F32A Depression, unspecified: Secondary | ICD-10-CM | POA: Insufficient documentation

## 2020-09-26 DIAGNOSIS — E7800 Pure hypercholesterolemia, unspecified: Secondary | ICD-10-CM | POA: Insufficient documentation

## 2020-09-26 DIAGNOSIS — D7581 Myelofibrosis: Secondary | ICD-10-CM | POA: Insufficient documentation

## 2020-09-26 DIAGNOSIS — H903 Sensorineural hearing loss, bilateral: Secondary | ICD-10-CM | POA: Diagnosis not present

## 2020-09-26 DIAGNOSIS — M1990 Unspecified osteoarthritis, unspecified site: Secondary | ICD-10-CM | POA: Insufficient documentation

## 2020-09-27 ENCOUNTER — Other Ambulatory Visit: Payer: Self-pay

## 2020-09-27 ENCOUNTER — Ambulatory Visit (INDEPENDENT_AMBULATORY_CARE_PROVIDER_SITE_OTHER): Payer: Medicare Other | Admitting: Cardiology

## 2020-09-27 ENCOUNTER — Encounter: Payer: Self-pay | Admitting: Cardiology

## 2020-09-27 VITALS — BP 154/76 | HR 82 | Ht 62.0 in | Wt 141.6 lb

## 2020-09-27 DIAGNOSIS — I10 Essential (primary) hypertension: Secondary | ICD-10-CM

## 2020-09-27 DIAGNOSIS — N189 Chronic kidney disease, unspecified: Secondary | ICD-10-CM

## 2020-09-27 DIAGNOSIS — E7800 Pure hypercholesterolemia, unspecified: Secondary | ICD-10-CM | POA: Diagnosis not present

## 2020-09-27 DIAGNOSIS — R079 Chest pain, unspecified: Secondary | ICD-10-CM

## 2020-09-27 DIAGNOSIS — R072 Precordial pain: Secondary | ICD-10-CM

## 2020-09-27 HISTORY — DX: Chest pain, unspecified: R07.9

## 2020-09-27 MED ORDER — NITROGLYCERIN 0.4 MG SL SUBL: 0.4000 mg | SUBLINGUAL_TABLET | SUBLINGUAL | 6 refills | Status: DC | PRN

## 2020-09-27 NOTE — Patient Instructions (Addendum)

## 2020-09-27 NOTE — Progress Notes (Signed)
Cardiology Office Note:    Date:  09/27/2020   ID:  Holly Nixon, DOB [birth year], MRN [id number]  PCP:  Leonides Sake, MD  Cardiologist:  Jenean Lindau, MD   Referring MD: Leonides Sake, MD    ASSESSMENT:    1. Essential hypertension   2. Hypercholesterolemia   3. Chronic kidney disease, unspecified CKD stage   4. Chest pain, unspecified type    PLAN:    In order of problems listed above:  Primary prevention stressed with the patient.  Importance of compliance with diet medication stressed and she vocalized understanding. Chest pain: She has multiple risk factors for coronary artery disease and I am not clear about her chest pain symptoms we will do exercise stress Cardiolite.  She was advised not to stress herself in the next few days and not to exercise.  Sublingual nitroglycerin prescription was sent, its protocol and 911 protocol explained and the patient vocalized understanding questions were answered to the patient's satisfaction. Essential hypertension: Blood pressure stable and diet was emphasized.  She is brought multiple blood pressure readings and they are excellent. Mixed dyslipidemia: Lipids followed by primary care physician and she is on statin therapy. Renal insufficiency: Appears to be stable and followed by primary care.  I do not have recent numbers but previous numbers a couple of years ago was abnormal. She knows to go to the nearest emergency room for any concerning symptoms.Patient will be seen in follow-up appointment in 3 months or earlier if the patient has any concerns    Medication Adjustments/Labs and Tests Ordered: Current medicines are reviewed at length with the patient today.  Concerns regarding medicines are outlined above.  No orders of the defined types were placed in this encounter.  No orders of the defined types were placed in this encounter.    History of Present Illness:    Holly Nixon is a 75 y.o. female who is  being seen today for the evaluation of chest pain at the request of Hamrick, Lorin Mercy, MD. patient is a pleasant 75 year old female with past medical history of essential hypertension, mixed dyslipidemia.  She mentions to me that over the past several months when she exerts she has some chest tightness.  No orthopnea or PND.  She takes care of activities of daily living without any problem.  Because of the symptoms she wanted to be evaluated.  No radiation of the symptoms to the neck or to the arms.  She has renal insufficiency also.  At the time of my evaluation, the patient is alert awake oriented and in no distress.  Past Medical History:  Diagnosis Date   Allergy    SEASONAL   Anxiety    Arthritis    back,hands   Asthma    took shots   Bilateral sensorineural hearing loss 07/14/2019   Breast lump 08/29/2020   Chronic kidney disease    Stage 3/ moderate   Depression    Environmental allergies    Essential hypertension 06/12/2015   Fatigue 06/29/2015   Glaucoma    Hypercholesterolemia    Myelofibrosis ([place])    Rectocele    Sleep apnea, obstructive 06/29/2015   Has CPAP    Past Surgical History:  Procedure Laterality Date   ABDOMINAL HYSTERECTOMY  1992   total   COLONOSCOPY     TUBAL LIGATION      Current Medications: Current Meds  Medication Sig   acyclovir (ZOVIRAX) 400 MG tablet Take 400 mg  by mouth daily.   buPROPion (WELLBUTRIN XL) 150 MG 24 hr tablet Take 150 mg by mouth daily.   Cholecalciferol (VITAMIN D) 2000 UNITS tablet Take 2,000 Units by mouth daily.   estradiol (ESTRACE) 0.5 MG tablet Take 0.5 mg by mouth daily.   Famotidine (PEPCID PO) Take 1 tablet by mouth daily.   Krill Oil 500 MG CAPS Take 500 mg by mouth daily.   Lactase (LACTAID PO) Take 1 tablet by mouth daily.   levocetirizine (XYZAL) 5 MG tablet Take 5 mg by mouth every evening.   losartan (COZAAR) 50 MG tablet Take 25 mg by mouth daily.   Probiotic Product (PROBIOTIC PO) Take 1 tablet by mouth  daily.   rosuvastatin (CRESTOR) 10 MG tablet Take 10 mg by mouth daily.   vitamin C (ASCORBIC ACID) 500 MG tablet Take 500 mg by mouth daily.     Allergies:   Aspirin, Nsaids, Other, Penicillins, and Sulfonamide derivatives   Social History   Socioeconomic History   Marital status: Married    Spouse name: Not on file   Number of children: Not on file   Years of education: Not on file   Highest education level: Not on file  Occupational History   Not on file  Tobacco Use   Smoking status: Former    Types: Cigarettes    Quit date: 03/05/1978    Years since quitting: 42.5   Smokeless tobacco: Never  Vaping Use   Vaping Use: Never used  Substance and Sexual Activity   Alcohol use: Yes    Alcohol/week: 2.0 standard drinks    Types: 2 Shots of liquor per week   Drug use: No   Sexual activity: Not on file  Other Topics Concern   Not on file  Social History Narrative   Not on file   Social Determinants of Health   Financial Resource Strain: Not on file  Food Insecurity: Not on file  Transportation Needs: Not on file  Physical Activity: Not on file  Stress: Not on file  Social Connections: Not on file     Family History: The patient's family history includes Colon cancer in her mother; Congenital heart disease in her brother; Congestive Heart Failure in her sister; Coronary artery disease in her father; Dementia in her mother; Diabetes in her sister; Heart attack in her father; Heart disease in her father; Lymphoma in her daughter; Stroke in her maternal grandmother and mother. There is no history of Esophageal cancer, Rectal cancer, or Stomach cancer.  ROS:   Please see the history of present illness.    All other systems reviewed and are negative.  EKGs/Labs/Other Studies Reviewed:    The following studies were reviewed today: EKG reveals sinus rhythm and nonspecific ST-T changes   Recent Labs: No results found for requested labs within last 8760 hours.  Recent  Lipid Panel No results found for: CHOL, TRIG, HDL, CHOLHDL, VLDL, LDLCALC, LDLDIRECT  Physical Exam:    VS:  BP (!) 154/76   Pulse 82   Ht '5\' 2"'$  (1.575 m)   Wt 141 lb 9.6 oz (64.2 kg)   SpO2 98%   BMI 25.90 kg/m     Wt Readings from Last 3 Encounters:  09/27/20 141 lb 9.6 oz (64.2 kg)  08/11/20 144 lb (65.3 kg)  08/22/18 146 lb (66.2 kg)     GEN: Patient is in no acute distress HEENT: Normal NECK: No JVD; No carotid bruits LYMPHATICS: No lymphadenopathy CARDIAC: S1 S2 regular, 2/6  systolic murmur at the apex. RESPIRATORY:  Clear to auscultation without rales, wheezing or rhonchi  ABDOMEN: Soft, non-tender, non-distended MUSCULOSKELETAL:  No edema; No deformity  SKIN: Warm and dry NEUROLOGIC:  Alert and oriented x 3 PSYCHIATRIC:  Normal affect    Signed, Jenean Lindau, MD  09/27/2020 10:55 AM    [place]

## 2020-09-28 ENCOUNTER — Encounter: Payer: Self-pay | Admitting: *Deleted

## 2020-09-28 ENCOUNTER — Telehealth: Payer: Self-pay | Admitting: *Deleted

## 2020-09-28 NOTE — Telephone Encounter (Signed)
Patient given detailed instructions per Myocardial Perfusion Study Information Sheet for the test on 10/06/20 at 0800. Patient notified to arrive 15 minutes early and that it is imperative to arrive on time for appointment to keep from having the test rescheduled.  If you need to cancel or reschedule your appointment, please call the office within 24 hours of your appointment. . Patient verbalized understanding.[place] Mychart letter sent

## 2020-10-03 ENCOUNTER — Telehealth: Payer: Self-pay | Admitting: Cardiology

## 2020-10-03 NOTE — Telephone Encounter (Signed)
Pt is currently scheduled for a test on 10/05/20 that requires her to remove all her jewelry, pt is unable to remove her rings so she  wants to know whats going to happen since she cant get her rings off

## 2020-10-03 NOTE — Telephone Encounter (Signed)
Patient was able to remove her rings as instructed

## 2020-10-06 DIAGNOSIS — Z20822 Contact with and (suspected) exposure to covid-19: Secondary | ICD-10-CM | POA: Diagnosis not present

## 2020-10-07 DIAGNOSIS — U071 COVID-19: Secondary | ICD-10-CM | POA: Diagnosis not present

## 2020-10-11 ENCOUNTER — Other Ambulatory Visit: Payer: Medicare Other

## 2020-10-19 DIAGNOSIS — R059 Cough, unspecified: Secondary | ICD-10-CM | POA: Diagnosis not present

## 2020-10-19 DIAGNOSIS — Z6827 Body mass index (BMI) 27.0-27.9, adult: Secondary | ICD-10-CM | POA: Diagnosis not present

## 2020-10-19 DIAGNOSIS — U071 COVID-19: Secondary | ICD-10-CM | POA: Diagnosis not present

## 2020-10-19 DIAGNOSIS — R0602 Shortness of breath: Secondary | ICD-10-CM | POA: Diagnosis not present

## 2020-10-24 ENCOUNTER — Other Ambulatory Visit: Payer: Medicare Other

## 2020-11-01 ENCOUNTER — Telehealth (HOSPITAL_COMMUNITY): Payer: Self-pay | Admitting: *Deleted

## 2020-11-01 NOTE — Telephone Encounter (Signed)
Patient given detailed instructions per Myocardial Perfusion Study Information Sheet for the test on 11/08/20 at 7:45. Patient notified to arrive 15 minutes early and that it is imperative to arrive on time for appointment to keep from having the test rescheduled.  If you need to cancel or reschedule your appointment, please call the office within 24 hours of your appointment. . Patient verbalized understanding.Veronia Beets

## 2020-11-03 ENCOUNTER — Other Ambulatory Visit: Payer: Self-pay

## 2020-11-03 ENCOUNTER — Ambulatory Visit (INDEPENDENT_AMBULATORY_CARE_PROVIDER_SITE_OTHER): Payer: Medicare Other

## 2020-11-03 DIAGNOSIS — R079 Chest pain, unspecified: Secondary | ICD-10-CM

## 2020-11-03 DIAGNOSIS — Z20822 Contact with and (suspected) exposure to covid-19: Secondary | ICD-10-CM | POA: Diagnosis not present

## 2020-11-03 LAB — ECHOCARDIOGRAM COMPLETE
Area-P 1/2: 3.65 cm2
Calc EF: 53.3 %
S' Lateral: 3.4 cm
Single Plane A2C EF: 56 %
Single Plane A4C EF: 45.8 %

## 2020-11-08 ENCOUNTER — Other Ambulatory Visit: Payer: Self-pay

## 2020-11-08 ENCOUNTER — Ambulatory Visit (INDEPENDENT_AMBULATORY_CARE_PROVIDER_SITE_OTHER): Payer: Medicare Other

## 2020-11-08 DIAGNOSIS — R079 Chest pain, unspecified: Secondary | ICD-10-CM | POA: Diagnosis not present

## 2020-11-08 DIAGNOSIS — R072 Precordial pain: Secondary | ICD-10-CM | POA: Diagnosis not present

## 2020-11-08 LAB — MYOCARDIAL PERFUSION IMAGING
Angina Index: 0
Estimated workload: 7
Exercise duration (min): 6 min
Exercise duration (sec): 0 s
LV dias vol: 48 mL (ref 46–106)
LV sys vol: 16 mL
MPHR: 145 {beats}/min
Nuc Stress EF: 68 %
Peak HR: 144 {beats}/min
Percent HR: 99 %
Rest HR: 89 {beats}/min
Rest Nuclear Isotope Dose: 10.9 mCi
SDS: 7
SRS: 2
SSS: 9
Stress Nuclear Isotope Dose: 31.6 mCi
TID: 0.97

## 2020-11-08 MED ORDER — TECHNETIUM TC 99M TETROFOSMIN IV KIT
31.6000 | PACK | Freq: Once | INTRAVENOUS | Status: AC | PRN
  Administered 2020-11-08: 31.6 via INTRAVENOUS

## 2020-11-08 MED ORDER — TECHNETIUM TC 99M TETROFOSMIN IV KIT
10.9000 | PACK | Freq: Once | INTRAVENOUS | Status: AC | PRN
  Administered 2020-11-08: 10.9 via INTRAVENOUS

## 2020-11-09 ENCOUNTER — Telehealth: Payer: Self-pay

## 2020-11-09 NOTE — Telephone Encounter (Signed)
-----   Message from Jenean Lindau, MD sent at 11/08/2020  4:40 PM EDT ----- The results of the study is unremarkable. Please inform patient. I will discuss in detail at next appointment. Cc  primary care/referring physician Jenean Lindau, MD 11/08/2020 4:40 PM

## 2020-11-09 NOTE — Telephone Encounter (Signed)
Patient notified of results. Copy sent to PCP

## 2020-11-24 ENCOUNTER — Other Ambulatory Visit: Payer: Self-pay

## 2020-11-25 ENCOUNTER — Other Ambulatory Visit: Payer: Self-pay

## 2020-11-25 ENCOUNTER — Ambulatory Visit (INDEPENDENT_AMBULATORY_CARE_PROVIDER_SITE_OTHER): Payer: Medicare Other | Admitting: Cardiology

## 2020-11-25 ENCOUNTER — Encounter: Payer: Self-pay | Admitting: Cardiology

## 2020-11-25 VITALS — BP 144/80 | HR 92 | Ht 63.0 in | Wt 136.4 lb

## 2020-11-25 DIAGNOSIS — E7800 Pure hypercholesterolemia, unspecified: Secondary | ICD-10-CM | POA: Diagnosis not present

## 2020-11-25 DIAGNOSIS — I10 Essential (primary) hypertension: Secondary | ICD-10-CM | POA: Diagnosis not present

## 2020-11-25 DIAGNOSIS — G4733 Obstructive sleep apnea (adult) (pediatric): Secondary | ICD-10-CM | POA: Diagnosis not present

## 2020-11-25 MED ORDER — METOPROLOL SUCCINATE ER 50 MG PO TB24: 50.0000 mg | ORAL_TABLET | Freq: Every day | ORAL | 3 refills | Status: DC

## 2020-11-25 MED ORDER — METOPROLOL SUCCINATE ER 50 MG PO TB24: 50.0000 mg | ORAL_TABLET | Freq: Every day | ORAL | 0 refills | Status: DC

## 2020-11-25 NOTE — Progress Notes (Signed)
Cardiology Office Note:    Date:  11/25/2020   ID:  MAKYNLEE KRESSIN, DOB 01-12-1946, MRN [id number]  PCP:  Leonides Sake, MD  Cardiologist:  Jenean Lindau, MD   Referring MD: Leonides Sake, MD    ASSESSMENT:    1. Essential hypertension   2. Hypercholesterolemia   3. Sleep apnea, obstructive    PLAN:    In order of problems listed above:  I discussed my findings with the patient at length and primary prevention stressed.  Importance of compliance with diet medication stressed and she vocalized understanding. Essential hypertension: Blood pressure stable and diet was emphasized.  Lifestyle modification urged.  Blood pressure stable.  She gives me history of elevated heart rate.  Switched her medication to Toprol-XL 50 mg daily she will send me a log of pulse and blood pressure in 2 weeks. Mixed dyslipidemia: On statin therapy and managed by primary care.  Diet emphasized. Sleep apnea: Sleep health issues were discussed.  Please have followed by primary care. Patient will be seen in follow-up appointment in 12 months or earlier if the patient has any concerns    Medication Adjustments/Labs and Tests Ordered: Current medicines are reviewed at length with the patient today.  Concerns regarding medicines are outlined above.  No orders of the defined types were placed in this encounter.  No orders of the defined types were placed in this encounter.    No chief complaint on file.    History of Present Illness:    Holly Nixon is a 75 y.o. female.  Patient has past medical history of essential hypertension dyslipidemia and sleep apnea.  She denies any problems at this time and takes care of activities of daily living.  No chest pain orthopnea or PND.  At the time of my evaluation, the patient is alert awake oriented and in no distress.  She has now started walking half an hour on a daily basis.  Past Medical History:  Diagnosis Date   Allergy    SEASONAL    Anxiety    Arthritis    back,hands   Asthma    took shots   Bilateral sensorineural hearing loss 07/14/2019   Breast lump 08/29/2020   Chest pain, unspecified 09/27/2020   Chronic kidney disease    Stage 3/ moderate   Depression    Environmental allergies    Essential hypertension 06/12/2015   Fatigue 06/29/2015   Glaucoma    Hypercholesterolemia    Myelofibrosis ([place])    Rectocele    Sleep apnea, obstructive 06/29/2015   Has CPAP    Past Surgical History:  Procedure Laterality Date   ABDOMINAL HYSTERECTOMY  1992   total   COLONOSCOPY     TUBAL LIGATION      Current Medications: Current Meds  Medication Sig   acyclovir (ZOVIRAX) 400 MG tablet Take 400 mg by mouth daily.   buPROPion (WELLBUTRIN XL) 150 MG 24 hr tablet Take 150 mg by mouth daily.   Cholecalciferol (VITAMIN D) 2000 UNITS tablet Take 2,000 Units by mouth daily.   estradiol (ESTRACE) 0.5 MG tablet Take 0.5 mg by mouth daily.   Krill Oil 500 MG CAPS Take 500 mg by mouth daily.   levocetirizine (XYZAL) 5 MG tablet Take 5 mg by mouth every evening.   losartan (COZAAR) 50 MG tablet Take 25 mg by mouth daily.   nitroGLYCERIN (NITROSTAT) 0.4 MG SL tablet Place 0.4 mg under the tongue every 5 (five) minutes as needed  for chest pain.   rosuvastatin (CRESTOR) 10 MG tablet Take 10 mg by mouth daily.     Allergies:   Aspirin, Nsaids, Other, Penicillins, and Sulfonamide derivatives   Social History   Socioeconomic History   Marital status: Married    Spouse name: Not on file   Number of children: Not on file   Years of education: Not on file   Highest education level: Not on file  Occupational History   Not on file  Tobacco Use   Smoking status: Former    Types: Cigarettes    Quit date: 03/05/1978    Years since quitting: 42.7   Smokeless tobacco: Never  Vaping Use   Vaping Use: Never used  Substance and Sexual Activity   Alcohol use: Yes    Alcohol/week: 2.0 standard drinks    Types: 2 Shots of liquor  per week   Drug use: No   Sexual activity: Not on file  Other Topics Concern   Not on file  Social History Narrative   Not on file   Social Determinants of Health   Financial Resource Strain: Not on file  Food Insecurity: Not on file  Transportation Needs: Not on file  Physical Activity: Not on file  Stress: Not on file  Social Connections: Not on file     Family History: The patient's family history includes Colon cancer in her mother; Congenital heart disease in her brother; Congestive Heart Failure in her sister; Coronary artery disease in her father; Dementia in her mother; Diabetes in her sister; Heart attack in her father; Heart disease in her father; Lymphoma in her daughter; Stroke in her maternal grandmother and mother. There is no history of Esophageal cancer, Rectal cancer, or Stomach cancer.  ROS:   Please see the history of present illness.    All other systems reviewed and are negative.  EKGs/Labs/Other Studies Reviewed:    The following studies were reviewed today: Study Highlights      The study is normal. The study is low risk.   Left ventricular function is normal. Nuclear stress EF: 68 %. The left ventricular ejection fraction is hyperdynamic (>65%).   Prior study not available for comparison.  IMPRESSIONS     1. Left ventricular ejection fraction, by estimation, is 50 to 55%. The  left ventricle has low normal function. The left ventricle has no regional  wall motion abnormalities. Left ventricular diastolic parameters are  consistent with Grade I diastolic  dysfunction (impaired relaxation). The average left ventricular global  longitudinal strain is -9.7 %. The global longitudinal strain is abnormal.   2. Right ventricular systolic function is normal. The right ventricular  size is normal. There is normal pulmonary artery systolic pressure.   3. The mitral valve is normal in structure. No evidence of mitral valve  regurgitation. No evidence of  mitral stenosis.   4. The aortic valve is normal in structure. Aortic valve regurgitation is  not visualized. No aortic stenosis is present.   5. The inferior vena cava is normal in size with greater than 50%  respiratory variability, suggesting right atrial pressure of 3 mmHg.    Recent Labs: No results found for requested labs within last 8760 hours.  Recent Lipid Panel No results found for: CHOL, TRIG, HDL, CHOLHDL, VLDL, LDLCALC, LDLDIRECT  Physical Exam:    VS:  BP (!) 144/80   Pulse 92   Ht 5\' 3"  (1.6 m)   Wt 136 lb 6.4 oz (61.9 kg)  SpO2 98%   BMI 24.16 kg/m     Wt Readings from Last 3 Encounters:  11/25/20 136 lb 6.4 oz (61.9 kg)  11/08/20 141 lb (64 kg)  09/27/20 141 lb 9.6 oz (64.2 kg)     GEN: Patient is in no acute distress HEENT: Normal NECK: No JVD; No carotid bruits LYMPHATICS: No lymphadenopathy CARDIAC: Hear sounds regular, 2/6 systolic murmur at the apex. RESPIRATORY:  Clear to auscultation without rales, wheezing or rhonchi  ABDOMEN: Soft, non-tender, non-distended MUSCULOSKELETAL:  No edema; No deformity  SKIN: Warm and dry NEUROLOGIC:  Alert and oriented x 3 PSYCHIATRIC:  Normal affect   Signed, Jenean Lindau, MD  11/25/2020 2:12 PM    [facility]

## 2020-11-25 NOTE — Addendum Note (Signed)
Addended by: Truddie Hidden on: 11/25/2020 02:18 PM   Modules accepted: Orders

## 2020-11-25 NOTE — Addendum Note (Signed)
Addended by: Truddie Hidden on: 11/25/2020 02:29 PM   Modules accepted: Orders

## 2020-11-25 NOTE — Patient Instructions (Addendum)
Medication Instructions:  Your physician has recommended you make the following change in your medication:   Stop Losartan Start Toprol XL 50 mg daily.  *If you need a refill on your cardiac medications before your next appointment, please call your pharmacy*   Lab Work: None ordered If you have labs (blood work) drawn today and your tests are completely normal, you will receive your results only by: [place] (if you have MyChart) OR A paper copy in the mail If you have any lab test that is abnormal or we need to change your treatment, we will call you to review the results.   Testing/Procedures: None ordered   Follow-Up: At [facility], you and your health needs are our priority.  As part of our continuing mission to provide you with exceptional heart care, we have created designated Provider Care Teams.  These Care Teams include your primary Cardiologist (physician) and Advanced Practice Providers (APPs -  Physician Assistants and Nurse Practitioners) who all work together to provide you with the care you need, when you need it.  We recommend signing up for the patient portal called "MyChart".  Sign up information is provided on this After Visit Summary.  MyChart is used to connect with patients for Virtual Visits (Telemedicine).  Patients are able to view lab/test results, encounter notes, upcoming appointments, etc.  Non-urgent messages can be sent to your provider as well.   To learn more about what you can do with MyChart, go to [email or web address].    Your next appointment:   12 month(s)  The format for your next appointment:   In Person  Provider:   Jyl Heinz, MD   Other Instructions NA

## 2020-12-03 DIAGNOSIS — Z20822 Contact with and (suspected) exposure to covid-19: Secondary | ICD-10-CM | POA: Diagnosis not present

## 2020-12-04 ENCOUNTER — Telehealth: Payer: Self-pay | Admitting: Physician Assistant

## 2020-12-04 NOTE — Telephone Encounter (Signed)
   The patient called the answering service after-hours today. Recently saw Dr. Geraldo Pitter in the office - per his note, "  Blood pressure stable.  She gives me history of elevated heart rate.  Switched her medication to Toprol-XL 50 mg daily she will send me a log of pulse and blood pressure in 2 weeks." Her losartan was stopped. She has been trending her BP daily and mostly seeing readings in the 130s. Earlier today she had isolated reading of 99/56 which scared her quite a bit, with subsequent BP recheck 152/80 then a recheck with a different cuff of 164/80. No dizziness or symptoms of low BP. She wanted to know if she should stop the metoprolol or decrease the dose. I told her I would not recommend holding for one isolated value that could not be reproduced especially since subsequent values were elevated. Advised she keep a log the next several days and call in to report values to Dr. Richarda Blade. I did tell her if she has any additional low readings to notify sooner, but otherwise submit BP record as above. The patient verbalized understanding and gratitude.  Charlie Pitter, PA-C

## 2020-12-06 NOTE — Telephone Encounter (Signed)
Spoke with the patient and she states that she feels tired but that her BP is better. Pt states that she sent her BP log in already via MyChart.

## 2020-12-20 DIAGNOSIS — N1831 Chronic kidney disease, stage 3a: Secondary | ICD-10-CM | POA: Diagnosis not present

## 2020-12-20 DIAGNOSIS — E782 Mixed hyperlipidemia: Secondary | ICD-10-CM | POA: Diagnosis not present

## 2020-12-22 DIAGNOSIS — L814 Other melanin hyperpigmentation: Secondary | ICD-10-CM | POA: Diagnosis not present

## 2020-12-22 DIAGNOSIS — D225 Melanocytic nevi of trunk: Secondary | ICD-10-CM | POA: Diagnosis not present

## 2020-12-22 DIAGNOSIS — I519 Heart disease, unspecified: Secondary | ICD-10-CM | POA: Diagnosis not present

## 2020-12-22 DIAGNOSIS — Z6825 Body mass index (BMI) 25.0-25.9, adult: Secondary | ICD-10-CM | POA: Diagnosis not present

## 2020-12-22 DIAGNOSIS — D7581 Myelofibrosis: Secondary | ICD-10-CM | POA: Diagnosis not present

## 2020-12-22 DIAGNOSIS — E782 Mixed hyperlipidemia: Secondary | ICD-10-CM | POA: Diagnosis not present

## 2020-12-22 DIAGNOSIS — N1831 Chronic kidney disease, stage 3a: Secondary | ICD-10-CM | POA: Diagnosis not present

## 2020-12-22 DIAGNOSIS — L821 Other seborrheic keratosis: Secondary | ICD-10-CM | POA: Diagnosis not present

## 2020-12-22 DIAGNOSIS — L578 Other skin changes due to chronic exposure to nonionizing radiation: Secondary | ICD-10-CM | POA: Diagnosis not present

## 2020-12-22 DIAGNOSIS — D2261 Melanocytic nevi of right upper limb, including shoulder: Secondary | ICD-10-CM | POA: Diagnosis not present

## 2020-12-22 DIAGNOSIS — I10 Essential (primary) hypertension: Secondary | ICD-10-CM | POA: Diagnosis not present

## 2020-12-25 DIAGNOSIS — Z23 Encounter for immunization: Secondary | ICD-10-CM | POA: Diagnosis not present

## 2021-01-03 DIAGNOSIS — Z20822 Contact with and (suspected) exposure to covid-19: Secondary | ICD-10-CM | POA: Diagnosis not present

## 2021-01-12 DIAGNOSIS — E785 Hyperlipidemia, unspecified: Secondary | ICD-10-CM | POA: Diagnosis not present

## 2021-01-12 DIAGNOSIS — Z1331 Encounter for screening for depression: Secondary | ICD-10-CM | POA: Diagnosis not present

## 2021-01-12 DIAGNOSIS — Z0000 Encounter for general adult medical examination without abnormal findings: Secondary | ICD-10-CM | POA: Diagnosis not present

## 2021-01-12 DIAGNOSIS — Z9181 History of falling: Secondary | ICD-10-CM | POA: Diagnosis not present

## 2021-01-25 ENCOUNTER — Encounter: Payer: Self-pay | Admitting: Cardiology

## 2021-03-30 ENCOUNTER — Encounter: Payer: Self-pay | Admitting: Cardiology

## 2021-03-30 DIAGNOSIS — I10 Essential (primary) hypertension: Secondary | ICD-10-CM

## 2021-03-30 MED ORDER — DILTIAZEM HCL ER COATED BEADS 120 MG PO CP24: 120.0000 mg | ORAL_CAPSULE | Freq: Every day | ORAL | 0 refills | Status: DC

## 2021-04-09 ENCOUNTER — Encounter: Payer: Self-pay | Admitting: Cardiology

## 2021-04-09 DIAGNOSIS — I10 Essential (primary) hypertension: Secondary | ICD-10-CM

## 2021-04-10 MED ORDER — DILTIAZEM HCL ER COATED BEADS 120 MG PO CP24: 120.0000 mg | ORAL_CAPSULE | Freq: Every day | ORAL | 3 refills | Status: DC

## 2021-04-30 ENCOUNTER — Encounter: Payer: Self-pay | Admitting: Cardiology

## 2021-05-04 DIAGNOSIS — I10 Essential (primary) hypertension: Secondary | ICD-10-CM | POA: Diagnosis not present

## 2021-05-04 DIAGNOSIS — N182 Chronic kidney disease, stage 2 (mild): Secondary | ICD-10-CM | POA: Diagnosis not present

## 2021-05-04 DIAGNOSIS — F320 Major depressive disorder, single episode, mild: Secondary | ICD-10-CM | POA: Diagnosis not present

## 2021-05-04 DIAGNOSIS — F419 Anxiety disorder, unspecified: Secondary | ICD-10-CM | POA: Diagnosis not present

## 2021-05-04 DIAGNOSIS — F3341 Major depressive disorder, recurrent, in partial remission: Secondary | ICD-10-CM | POA: Diagnosis not present

## 2021-05-04 DIAGNOSIS — Z1331 Encounter for screening for depression: Secondary | ICD-10-CM | POA: Diagnosis not present

## 2021-05-05 ENCOUNTER — Telehealth: Payer: Self-pay | Admitting: Cardiology

## 2021-05-05 NOTE — Telephone Encounter (Signed)
Patient requesting to switch from Dr. Revankar to Dr. Tobb.  °

## 2021-05-08 NOTE — Telephone Encounter (Signed)
That will be fine with me. 

## 2021-05-25 DIAGNOSIS — Z20822 Contact with and (suspected) exposure to covid-19: Secondary | ICD-10-CM | POA: Diagnosis not present

## 2021-06-05 DIAGNOSIS — N1831 Chronic kidney disease, stage 3a: Secondary | ICD-10-CM | POA: Diagnosis not present

## 2021-06-05 DIAGNOSIS — E782 Mixed hyperlipidemia: Secondary | ICD-10-CM | POA: Diagnosis not present

## 2021-06-05 DIAGNOSIS — I10 Essential (primary) hypertension: Secondary | ICD-10-CM | POA: Diagnosis not present

## 2021-06-05 DIAGNOSIS — I519 Heart disease, unspecified: Secondary | ICD-10-CM | POA: Diagnosis not present

## 2021-06-05 DIAGNOSIS — D7581 Myelofibrosis: Secondary | ICD-10-CM | POA: Diagnosis not present

## 2021-06-05 DIAGNOSIS — Z139 Encounter for screening, unspecified: Secondary | ICD-10-CM | POA: Diagnosis not present

## 2021-06-14 ENCOUNTER — Encounter: Payer: Self-pay | Admitting: Cardiology

## 2021-06-14 ENCOUNTER — Ambulatory Visit (INDEPENDENT_AMBULATORY_CARE_PROVIDER_SITE_OTHER): Payer: Medicare Other | Admitting: Cardiology

## 2021-06-14 VITALS — BP 156/84 | HR 76 | Ht 63.0 in | Wt 140.2 lb

## 2021-06-14 DIAGNOSIS — Z79899 Other long term (current) drug therapy: Secondary | ICD-10-CM

## 2021-06-14 DIAGNOSIS — E559 Vitamin D deficiency, unspecified: Secondary | ICD-10-CM

## 2021-06-14 DIAGNOSIS — I10 Essential (primary) hypertension: Secondary | ICD-10-CM | POA: Diagnosis not present

## 2021-06-14 MED ORDER — HYDROCHLOROTHIAZIDE 25 MG PO TABS: 25.0000 mg | ORAL_TABLET | Freq: Every day | ORAL | 3 refills | Status: DC

## 2021-06-14 NOTE — Patient Instructions (Addendum)
Medication Instructions:  ?Your physician has recommended you make the following change in your medication:  ?STOP: Cardizem ?INCREASE: Hydrochlorothiazide 25 mg once daily ?Please take Cardizem every other day until Monday then stop.  ?Take Hydrochlorothiazide 12.5 mg daily starting tomorrow and increase to 25 mg on Tuesday 06/20/2021.  ?*If you need a refill on your cardiac medications before your next appointment, please call your pharmacy* ? ? ?Lab Work: ?Your physician recommends that you return for lab work in: ?TODAY: Vitamin D, HgbA1c ?If you have labs (blood work) drawn today and your tests are completely normal, you will receive your results only by: ?MyChart Message (if you have MyChart) OR ?A paper copy in the mail ?If you have any lab test that is abnormal or we need to change your treatment, we will call you to review the results. ? ? ?Testing/Procedures: ?None ? ? ?Follow-Up: ?At [facility], you and your health needs are our priority.  As part of our continuing mission to provide you with exceptional heart care, we have created designated Provider Care Teams.  These Care Teams include your primary Cardiologist (physician) and Advanced Practice Providers (APPs -  Physician Assistants and Nurse Practitioners) who all work together to provide you with the care you need, when you need it. ? ?We recommend signing up for the patient portal called "MyChart".  Sign up information is provided on this After Visit Summary.  MyChart is used to connect with patients for Virtual Visits (Telemedicine).  Patients are able to view lab/test results, encounter notes, upcoming appointments, etc.  Non-urgent messages can be sent to your provider as well.   ?To learn more about what you can do with MyChart, go to [email or web address].   ? ?Your next appointment:   ?12 week(s) ? ?The format for your next appointment:   ?In Person ? ?Provider:   ?Berniece Salines, DO   ? ? ?Other Instructions ? ? ?Important Information  About [place] ? ? ? ? ?  ?

## 2021-06-14 NOTE — Progress Notes (Signed)
?Cardiology Office Note:   ? ?Date:  06/15/2021  ? ?ID:  Holly Nixon, DOB 1946-01-02, MRN [id number] ? ?PCP:  Leonides Sake, MD  ?Cardiologist:  Berniece Salines, DO  ?Electrophysiologist:  None  ? ?Referring MD: Leonides Sake, MD  ? ?" I want to talk about my blood pressure" ? ?History of Present Illness:   ? ?Holly Nixon is a 76 y.o. female with a hx of hypertension, hypercholesteremia sleep apnea here today for follow-up visit.  The patient previously saw Dr. Geraldo Pitter at her last visit was noted to be in November 25, 2020 at that time they discussed medications and it appears that she was switched from Toprol XL to Cardizem.  She was supposed to send blood pressure log.  Which the patient tells me that she did at the time.  In terms of her dyslipidemia she was kept on the statin medication.  She has sleep apnea and no longer is on CPAP because she had been taken off of this in the past by her care provider. ? ?The patient previously saw Dr. Geraldo Pitter and requested to switch because I take care of her husband.  This is my first visit with the patient. ?She tells me that she has been experiencing mid sternal chest pressure.  She notes that is off and on.  But is there.  She has been concerned about this.  But what is her biggest problem today is the fact that she is hypertensive and has been treated with Cardizem which has made her also constipated. ? ?She was seen in [place] where she was given hydrochlorothiazide as needed for systolic blood pressure over 150 she is needed this twice. ? ?Past Medical History:  ?Diagnosis Date  ? Allergy   ? SEASONAL  ? Anxiety   ? Arthritis   ? back,hands  ? Asthma   ? took shots  ? Bilateral sensorineural hearing loss 07/14/2019  ? Breast lump 08/29/2020  ? Chest pain, unspecified 09/27/2020  ? Chronic kidney disease   ? Stage 3/ moderate  ? Depression   ? Environmental allergies   ? Essential hypertension 06/12/2015  ? Fatigue 06/29/2015  ? Glaucoma   ?  Hypercholesterolemia   ? Myelofibrosis ([place])   ? Rectocele   ? Sleep apnea, obstructive 06/29/2015  ? Has CPAP  ? ? ?Past Surgical History:  ?Procedure Laterality Date  ? ABDOMINAL HYSTERECTOMY  1992  ? total  ? COLONOSCOPY    ? TUBAL LIGATION    ? ? ?Current Medications: ?Current Meds  ?Medication Sig  ? acyclovir (ZOVIRAX) 400 MG tablet Take 400 mg by mouth every other day. Monday , Wednesday and Friday  ? cholecalciferol (VITAMIN D3) 25 MCG (1000 UNIT) tablet Take 1,000 Units by mouth daily.  ? diltiazem (CARDIZEM CD) 120 MG 24 hr capsule Take 1 capsule (120 mg total) by mouth daily.  ? estradiol (ESTRACE) 0.5 MG tablet Take 0.5 mg by mouth daily.  ? folic acid (FOLVITE) 440 MCG tablet daily.  ? hydrochlorothiazide (HYDRODIURIL) 25 MG tablet Take 1 tablet (25 mg total) by mouth daily.  ? Krill Oil 500 MG CAPS Take 500 mg by mouth daily.  ? levocetirizine (XYZAL) 5 MG tablet Take 5 mg by mouth every evening.  ? nitroGLYCERIN (NITROSTAT) 0.4 MG SL tablet Place 0.4 mg under the tongue every 5 (five) minutes as needed for chest pain.  ? rosuvastatin (CRESTOR) 10 MG tablet Take 10 mg by mouth daily.  ? [DISCONTINUED] hydrochlorothiazide (MICROZIDE)  12.5 MG capsule SMARTSIG:1 Capsule(s) By Mouth PRN  ?  ? ?Allergies:   Aspirin, Nsaids, Other, Penicillins, and Sulfonamide derivatives  ? ?Social History  ? ?Socioeconomic History  ? Marital status: Married  ?  Spouse name: Not on file  ? Number of children: Not on file  ? Years of education: Not on file  ? Highest education level: Not on file  ?Occupational History  ? Not on file  ?Tobacco Use  ? Smoking status: Former  ?  Types: Cigarettes  ?  Quit date: 03/05/1978  ?  Years since quitting: 43.3  ? Smokeless tobacco: Never  ?Vaping Use  ? Vaping Use: Never used  ?Substance and Sexual Activity  ? Alcohol use: Yes  ?  Alcohol/week: 2.0 standard drinks  ?  Types: 2 Shots of liquor per week  ? Drug use: No  ? Sexual activity: Not on file  ?Other Topics Concern  ? Not on file   ?Social History Narrative  ? Not on file  ? ?Social Determinants of Health  ? ?Financial Resource Strain: Not on file  ?Food Insecurity: Not on file  ?Transportation Needs: Not on file  ?Physical Activity: Not on file  ?Stress: Not on file  ?Social Connections: Not on file  ?  ? ?Family History: ?The patient's family history includes Colon cancer in her mother; Congenital heart disease in her brother; Congestive Heart Failure in her sister; Coronary artery disease in her father; Dementia in her mother; Diabetes in her sister; Heart attack in her father; Heart disease in her father; Lymphoma in her daughter; Stroke in her maternal grandmother and mother. There is no history of Esophageal cancer, Rectal cancer, or Stomach cancer. ? ?ROS:   ?Review of Systems  ?Constitution: Negative for decreased appetite, fever and weight gain.  ?HENT: Negative for congestion, ear discharge, hoarse voice and sore throat.   ?Eyes: Negative for discharge, redness, vision loss in right eye and visual halos.  ?Cardiovascular: Negative for chest pain, dyspnea on exertion, leg swelling, orthopnea and palpitations.  ?Respiratory: Negative for cough, hemoptysis, shortness of breath and snoring.   ?Endocrine: Negative for heat intolerance and polyphagia.  ?Hematologic/Lymphatic: Negative for bleeding problem. Does not bruise/bleed easily.  ?Skin: Negative for flushing, nail changes, rash and suspicious lesions.  ?Musculoskeletal: Negative for arthritis, joint pain, muscle cramps, myalgias, neck pain and stiffness.  ?Gastrointestinal: Negative for abdominal pain, bowel incontinence, diarrhea and excessive appetite.  ?Genitourinary: Negative for decreased libido, genital sores and incomplete emptying.  ?Neurological: Negative for brief paralysis, focal weakness, headaches and loss of balance.  ?Psychiatric/Behavioral: Negative for altered mental status, depression and suicidal ideas.  ?Allergic/Immunologic: Negative for HIV exposure and  persistent infections.  ? ? ?EKGs/Labs/Other Studies Reviewed:   ? ?The following studies were reviewed today: ? ? ?EKG:  The ekg ordered today demonstrates  ? ?Nuclear  stress test 9//2022 ?  The study is normal. The study is low risk. ?  Left ventricular function is normal. Nuclear stress EF: 68 %. The left ventricular ejection fraction is hyperdynamic (>65%). ?  Prior study not available for comparison. ? ?TTE 9/1/2022IMPRESSIONS  ? 1. Left ventricular ejection fraction, by estimation, is 50 to 55%. The  ?left ventricle has low normal function. The left ventricle has no regional  ?wall motion abnormalities. Left ventricular diastolic parameters are  ?consistent with Grade I diastolic  ?dysfunction (impaired relaxation). The average left ventricular global  ?longitudinal strain is -9.7 %. The global longitudinal strain is abnormal.  ? 2.  Right ventricular systolic function is normal. The right ventricular  ?size is normal. There is normal pulmonary artery systolic pressure.  ? 3. The mitral valve is normal in structure. No evidence of mitral valve  ?regurgitation. No evidence of mitral stenosis.  ? 4. The aortic valve is normal in structure. Aortic valve regurgitation is  ?not visualized. No aortic stenosis is present.  ? 5. The inferior vena cava is normal in size with greater than 50%  ?respiratory variability, suggesting right atrial pressure of 3 mmHg.  ? ?Comparison(s): No prior Echocardiogram.  ? ?FINDINGS  ? Left Ventricle: Left ventricular ejection fraction, by estimation, is 50  ?to 55%. The left ventricle has low normal function. The left ventricle has  ?no regional wall motion abnormalities. The average left ventricular global  ?longitudinal strain is -9.7 %.  ? The global longitudinal strain is abnormal. The left ventricular internal  ?cavity size was normal in size. There is no left ventricular hypertrophy.  ?Left ventricular diastolic parameters are consistent with Grade I  ?diastolic dysfunction  (impaired relaxation).  ? ?Right Ventricle: The right ventricular size is normal. No increase in  ?right ventricular wall thickness. Right ventricular systolic function is  ?normal. There is normal pulmonary artery systolic p

## 2021-06-15 LAB — HEMOGLOBIN A1C
Est. average glucose Bld gHb Est-mCnc: 108 mg/dL
Hgb A1c MFr Bld: 5.4 % (ref 4.8–5.6)

## 2021-06-15 LAB — VITAMIN D 25 HYDROXY (VIT D DEFICIENCY, FRACTURES): Vit D, 25-Hydroxy: 50.6 ng/mL (ref 30.0–100.0)

## 2021-06-25 DIAGNOSIS — Z20822 Contact with and (suspected) exposure to covid-19: Secondary | ICD-10-CM | POA: Diagnosis not present

## 2021-06-30 ENCOUNTER — Encounter: Payer: Self-pay | Admitting: Cardiology

## 2021-07-03 DIAGNOSIS — N390 Urinary tract infection, site not specified: Secondary | ICD-10-CM | POA: Diagnosis not present

## 2021-07-04 ENCOUNTER — Ambulatory Visit: Payer: Medicare Other | Admitting: Cardiology

## 2021-07-06 DIAGNOSIS — Z20822 Contact with and (suspected) exposure to covid-19: Secondary | ICD-10-CM | POA: Diagnosis not present

## 2021-07-11 DIAGNOSIS — H1045 Other chronic allergic conjunctivitis: Secondary | ICD-10-CM | POA: Diagnosis not present

## 2021-07-11 DIAGNOSIS — J301 Allergic rhinitis due to pollen: Secondary | ICD-10-CM | POA: Diagnosis not present

## 2021-07-11 DIAGNOSIS — M79642 Pain in left hand: Secondary | ICD-10-CM | POA: Diagnosis not present

## 2021-07-11 DIAGNOSIS — J3081 Allergic rhinitis due to animal (cat) (dog) hair and dander: Secondary | ICD-10-CM | POA: Diagnosis not present

## 2021-07-11 DIAGNOSIS — R0602 Shortness of breath: Secondary | ICD-10-CM | POA: Diagnosis not present

## 2021-07-11 DIAGNOSIS — J3089 Other allergic rhinitis: Secondary | ICD-10-CM | POA: Diagnosis not present

## 2021-07-11 DIAGNOSIS — M79641 Pain in right hand: Secondary | ICD-10-CM | POA: Diagnosis not present

## 2021-07-13 ENCOUNTER — Other Ambulatory Visit (HOSPITAL_BASED_OUTPATIENT_CLINIC_OR_DEPARTMENT_OTHER): Payer: Self-pay | Admitting: Obstetrics and Gynecology

## 2021-07-14 ENCOUNTER — Other Ambulatory Visit (HOSPITAL_BASED_OUTPATIENT_CLINIC_OR_DEPARTMENT_OTHER): Payer: Self-pay | Admitting: Obstetrics and Gynecology

## 2021-07-14 DIAGNOSIS — E2839 Other primary ovarian failure: Secondary | ICD-10-CM

## 2021-07-18 ENCOUNTER — Ambulatory Visit (HOSPITAL_BASED_OUTPATIENT_CLINIC_OR_DEPARTMENT_OTHER)
Admission: RE | Admit: 2021-07-18 | Discharge: 2021-07-18 | Disposition: A | Discharge status: Home / Self Care | Payer: Medicare Other | Source: Ambulatory Visit | Attending: Obstetrics and Gynecology | Admitting: Obstetrics and Gynecology

## 2021-07-18 DIAGNOSIS — Z780 Asymptomatic menopausal state: Secondary | ICD-10-CM | POA: Diagnosis not present

## 2021-07-18 DIAGNOSIS — E2839 Other primary ovarian failure: Secondary | ICD-10-CM | POA: Diagnosis not present

## 2021-07-28 DIAGNOSIS — H52203 Unspecified astigmatism, bilateral: Secondary | ICD-10-CM | POA: Diagnosis not present

## 2021-07-28 DIAGNOSIS — H2513 Age-related nuclear cataract, bilateral: Secondary | ICD-10-CM | POA: Diagnosis not present

## 2021-07-28 DIAGNOSIS — H35363 Drusen (degenerative) of macula, bilateral: Secondary | ICD-10-CM | POA: Diagnosis not present

## 2021-08-23 DIAGNOSIS — Z01419 Encounter for gynecological examination (general) (routine) without abnormal findings: Secondary | ICD-10-CM | POA: Diagnosis not present

## 2021-08-23 DIAGNOSIS — Z1231 Encounter for screening mammogram for malignant neoplasm of breast: Secondary | ICD-10-CM | POA: Diagnosis not present

## 2021-08-29 ENCOUNTER — Encounter: Payer: Self-pay | Admitting: Cardiology

## 2021-08-29 NOTE — Telephone Encounter (Signed)
No new recommendations.  Pain lasting 1 to 2 seconds and worsening with bending over does not sound cardiac.  Holly Nixon

## 2021-09-25 ENCOUNTER — Ambulatory Visit (INDEPENDENT_AMBULATORY_CARE_PROVIDER_SITE_OTHER): Payer: Medicare Other | Admitting: Cardiology

## 2021-09-25 ENCOUNTER — Encounter: Payer: Self-pay | Admitting: Cardiology

## 2021-09-25 VITALS — BP 144/74 | HR 73 | Ht 63.0 in | Wt 135.6 lb

## 2021-09-25 DIAGNOSIS — R0789 Other chest pain: Secondary | ICD-10-CM | POA: Diagnosis not present

## 2021-09-25 DIAGNOSIS — I10 Essential (primary) hypertension: Secondary | ICD-10-CM | POA: Diagnosis not present

## 2021-09-25 DIAGNOSIS — E782 Mixed hyperlipidemia: Secondary | ICD-10-CM | POA: Diagnosis not present

## 2021-09-25 MED ORDER — ROSUVASTATIN CALCIUM 10 MG PO TABS: 10.0000 mg | ORAL_TABLET | Freq: Every day | ORAL | 3 refills | Status: DC

## 2021-09-25 MED ORDER — HYDROCHLOROTHIAZIDE 25 MG PO TABS: 25.0000 mg | ORAL_TABLET | Freq: Every day | ORAL | 3 refills | Status: DC

## 2021-09-25 NOTE — Progress Notes (Signed)
Cardiology Office Note:    Date:  09/25/2021   ID:  Holly Nixon, DOB December 12, 1945, MRN [id number]  PCP:  Leonides Sake, MD  Cardiologist:  Berniece Salines, DO  Electrophysiologist:  None   Referring MD: Leonides Sake, MD   " I am doing ok"  History of Present Illness:    Holly Nixon is a 76 y.o. female with a hx of hypertension, hypercholesteremia, hyperlipidemia sleep apnea here today for follow-up visit.  I saw the patient on June 14, 2021 this was after she requested to switch from Dr. Geraldo Pitter to my practice.  During that visit her blood pressure was elevated we will stop the Cardizem and start the patient on hydrochlorothiazide 25 mg daily.  I get vitamin D as well as hemoglobin A1c levels.  Since I saw the patient she tells me that her blood pressure has improved significantly with the use of hydrochlorothiazide.  She takes her blood pressure at home is averaging between the 562Z to 308M systolic, 57-84 diastolic.  Today she tells me that she has had an episode where she had left-sided under breast chest pain.  This usually only happen when she bent over to pick things.  It is pinpoint.  Nothing makes it better or worse.  She denies any other joint discomfort.  She tells me that recently she noted that she is not meeting her targeted steps of 10,000.  Though she still is active.  She wants to know if she should increase her activity.  Past Medical History:  Diagnosis Date   Allergy    SEASONAL   Anxiety    Arthritis    back,hands   Asthma    took shots   Bilateral sensorineural hearing loss 07/14/2019   Breast lump 08/29/2020   Chest pain, unspecified 09/27/2020   Chronic kidney disease    Stage 3/ moderate   Depression    Environmental allergies    Essential hypertension 06/12/2015   Fatigue 06/29/2015   Glaucoma    Hypercholesterolemia    Myelofibrosis ([place])    Rectocele    Sleep apnea, obstructive 06/29/2015   Has CPAP    Past Surgical History:   Procedure Laterality Date   ABDOMINAL HYSTERECTOMY  1992   total   COLONOSCOPY     TUBAL LIGATION      Current Medications: Current Meds  Medication Sig   acyclovir (ZOVIRAX) 400 MG tablet Take 400 mg by mouth every other day. Monday , Wednesday and Friday   cholecalciferol (VITAMIN D3) 25 MCG (1000 UNIT) tablet Take 1,000 Units by mouth daily.   Cyanocobalamin (VITAMIN B-12 PO) Take by mouth.   estradiol (ESTRACE) 0.5 MG tablet Take 0.5 mg by mouth daily.   folic acid (FOLVITE) 696 MCG tablet daily.   GARLIC PO Take by mouth.   Krill Oil 500 MG CAPS Take 500 mg by mouth daily.   levocetirizine (XYZAL) 5 MG tablet Take 5 mg by mouth every evening.   nitroGLYCERIN (NITROSTAT) 0.4 MG SL tablet Place 0.4 mg under the tongue every 5 (five) minutes as needed for chest pain.   [DISCONTINUED] hydrochlorothiazide (HYDRODIURIL) 25 MG tablet Take 1 tablet (25 mg total) by mouth daily.   [DISCONTINUED] rosuvastatin (CRESTOR) 10 MG tablet Take 10 mg by mouth daily.     Allergies:   Aspirin, Nsaids, Other, Penicillins, and Sulfonamide derivatives   Social History   Socioeconomic History   Marital status: Married    Spouse name: Not on file  Number of children: Not on file   Years of education: Not on file   Highest education level: Not on file  Occupational History   Not on file  Tobacco Use   Smoking status: Former    Types: Cigarettes    Quit date: 03/05/1978    Years since quitting: 43.5   Smokeless tobacco: Never  Vaping Use   Vaping Use: Never used  Substance and Sexual Activity   Alcohol use: Yes    Alcohol/week: 2.0 standard drinks of alcohol    Types: 2 Shots of liquor per week   Drug use: No   Sexual activity: Not on file  Other Topics Concern   Not on file  Social History Narrative   Not on file   Social Determinants of Health   Financial Resource Strain: Not on file  Food Insecurity: Not on file  Transportation Needs: Not on file  Physical Activity: Not on  file  Stress: Not on file  Social Connections: Not on file     Family History: The patient's family history includes Colon cancer in her mother; Congenital heart disease in her brother; Congestive Heart Failure in her sister; Coronary artery disease in her father; Dementia in her mother; Diabetes in her sister; Heart attack in her father; Heart disease in her father; Lymphoma in her daughter; Stroke in her maternal grandmother and mother. There is no history of Esophageal cancer, Rectal cancer, or Stomach cancer.  ROS:   Review of Systems  Constitution: Negative for decreased appetite, fever and weight gain.  HENT: Negative for congestion, ear discharge, hoarse voice and sore throat.   Eyes: Negative for discharge, redness, vision loss in right eye and visual halos.  Cardiovascular: Negative for chest pain, dyspnea on exertion, leg swelling, orthopnea and palpitations.  Respiratory: Negative for cough, hemoptysis, shortness of breath and snoring.   Endocrine: Negative for heat intolerance and polyphagia.  Hematologic/Lymphatic: Negative for bleeding problem. Does not bruise/bleed easily.  Skin: Negative for flushing, nail changes, rash and suspicious lesions.  Musculoskeletal: Negative for arthritis, joint pain, muscle cramps, myalgias, neck pain and stiffness.  Gastrointestinal: Negative for abdominal pain, bowel incontinence, diarrhea and excessive appetite.  Genitourinary: Negative for decreased libido, genital sores and incomplete emptying.  Neurological: Negative for brief paralysis, focal weakness, headaches and loss of balance.  Psychiatric/Behavioral: Negative for altered mental status, depression and suicidal ideas.  Allergic/Immunologic: Negative for HIV exposure and persistent infections.    EKGs/Labs/Other Studies Reviewed:    The following studies were reviewed today:   EKG:  The ekg ordered today demonstrates sinus rhythm 73 bpm  Nuclear  stress test 9//2022   The  study is normal. The study is low risk.   Left ventricular function is normal. Nuclear stress EF: 68 %. The left ventricular ejection fraction is hyperdynamic (>65%).   Prior study not available for comparison.   TTE 9/1/2022IMPRESSIONS   1. Left ventricular ejection fraction, by estimation, is 50 to 55%. The  left ventricle has low normal function. The left ventricle has no regional  wall motion abnormalities. Left ventricular diastolic parameters are  consistent with Grade I diastolic  dysfunction (impaired relaxation). The average left ventricular global  longitudinal strain is -9.7 %. The global longitudinal strain is abnormal.   2. Right ventricular systolic function is normal. The right ventricular  size is normal. There is normal pulmonary artery systolic pressure.   3. The mitral valve is normal in structure. No evidence of mitral valve  regurgitation. No evidence  of mitral stenosis.   4. The aortic valve is normal in structure. Aortic valve regurgitation is  not visualized. No aortic stenosis is present.   5. The inferior vena cava is normal in size with greater than 50%  respiratory variability, suggesting right atrial pressure of 3 mmHg.   Comparison(s): No prior Echocardiogram.   FINDINGS   Left Ventricle: Left ventricular ejection fraction, by estimation, is 50  to 55%. The left ventricle has low normal function. The left ventricle has  no regional wall motion abnormalities. The average left ventricular global  longitudinal strain is -9.7 %.   The global longitudinal strain is abnormal. The left ventricular internal  cavity size was normal in size. There is no left ventricular hypertrophy.  Left ventricular diastolic parameters are consistent with Grade I  diastolic dysfunction (impaired relaxation).   Right Ventricle: The right ventricular size is normal. No increase in  right ventricular wall thickness. Right ventricular systolic function is  normal. There is normal  pulmonary artery systolic pressure. The tricuspid  regurgitant velocity is 2.34 m/s, and   with an assumed right atrial pressure of 3 mmHg, the estimated right  ventricular systolic pressure is 40.9 mmHg.   Left Atrium: Left atrial size was normal in size.   Right Atrium: Right atrial size was normal in size.   Pericardium: There is no evidence of pericardial effusion.   Mitral Valve: The mitral valve is normal in structure. No evidence of  mitral valve regurgitation. No evidence of mitral valve stenosis.   Tricuspid Valve: The tricuspid valve is normal in structure. Tricuspid  valve regurgitation is not demonstrated. No evidence of tricuspid  stenosis.   Aortic Valve: The aortic valve is normal in structure. Aortic valve  regurgitation is not visualized. No aortic stenosis is present.   Pulmonic Valve: The pulmonic valve was normal in structure. Pulmonic valve  regurgitation is not visualized. No evidence of pulmonic stenosis.   Aorta: The aortic root is normal in size and structure.   Venous: The inferior vena cava is normal in size with greater than 50%  respiratory variability, suggesting right atrial pressure of 3 mmHg.   IAS/Shunts: No atrial level shunt detected by color flow Doppler.   Recent Labs: No results found for requested labs within last 365 days.  Recent Lipid Panel No results found for: "CHOL", "TRIG", "HDL", "CHOLHDL", "VLDL", "LDLCALC", "LDLDIRECT"  Physical Exam:    VS:  BP (!) 144/74   Pulse 73   Ht '5\' 3"'$  (1.6 m)   Wt 135 lb 9.6 oz (61.5 kg)   SpO2 99%   BMI 24.02 kg/m     Wt Readings from Last 3 Encounters:  09/25/21 135 lb 9.6 oz (61.5 kg)  06/14/21 140 lb 3.2 oz (63.6 kg)  11/25/20 136 lb 6.4 oz (61.9 kg)     GEN: Well nourished, well developed in no acute distress HEENT: Normal NECK: No JVD; No carotid bruits LYMPHATICS: No lymphadenopathy CARDIAC: S1S2 noted,RRR, no murmurs, rubs, gallops RESPIRATORY:  Clear to auscultation without  rales, wheezing or rhonchi  ABDOMEN: Soft, non-tender, non-distended, +bowel sounds, no guarding. EXTREMITIES: No edema, No cyanosis, no clubbing MUSCULOSKELETAL:  No deformity  SKIN: Warm and dry NEUROLOGIC:  Alert and oriented x 3, non-focal PSYCHIATRIC:  Normal affect, good insight  ASSESSMENT:    1. Essential hypertension   2. Mixed hyperlipidemia   3. Atypical chest pain    PLAN:    Her chest pain based on her description of when this  is happening and the characteristics of it sounds more like musculoskeletal and I shared this with the patient.  This does not sound like cardiac chest pain.  She has had a nuclear stress test which was done in September 2022 and it was negative.  I discussed with the patient if the characteristic of her chest pain changes we will consider cardiac evaluation.  In terms of increasing her exercise encouraged the patient and his okay to do so. She had questions about her echocardiogram I reviewed that with her again only evidence was diastolic dysfunction explained to the patient what this means.  We will continue the patient on hydrochlorothiazide.  Her blood pressure at home appears to be at target.  Slightly elevated here today which corresponded with watch on her blood pressure reading today.  I would avoid increasing her antihypertensive medication to avoid hypotension  The patient is in agreement with the above plan. The patient left the office in stable condition.  The patient will follow up in 9 months    Medication Adjustments/Labs and Tests Ordered: Current medicines are reviewed at length with the patient today.  Concerns regarding medicines are outlined above.  Orders Placed This Encounter  Procedures   EKG 12-Lead   Meds ordered this encounter  Medications   DISCONTD: rosuvastatin (CRESTOR) 10 MG tablet    Sig: Take 1 tablet (10 mg total) by mouth daily.    Dispense:  90 tablet    Refill:  3   hydrochlorothiazide (HYDRODIURIL) 25  MG tablet    Sig: Take 1 tablet (25 mg total) by mouth daily.    Dispense:  90 tablet    Refill:  3   rosuvastatin (CRESTOR) 10 MG tablet    Sig: Take 1 tablet (10 mg total) by mouth daily.    Dispense:  90 tablet    Refill:  3    Patient Instructions  Medication Instructions:  Your physician recommends that you continue on your current medications as directed. Please refer to the Current Medication list given to you today.  *If you need a refill on your cardiac medications before your next appointment, please call your pharmacy*   Lab Work: None If you have labs (blood work) drawn today and your tests are completely normal, you will receive your results only by: [place] (if you have MyChart) OR A paper copy in the mail If you have any lab test that is abnormal or we need to change your treatment, we will call you to review the results.   Testing/Procedures: None   Follow-Up: At [facility], you and your health needs are our priority.  As part of our continuing mission to provide you with exceptional heart care, we have created designated Provider Care Teams.  These Care Teams include your primary Cardiologist (physician) and Advanced Practice Providers (APPs -  Physician Assistants and Nurse Practitioners) who all work together to provide you with the care you need, when you need it.  We recommend signing up for the patient portal called "MyChart".  Sign up information is provided on this After Visit Summary.  MyChart is used to connect with patients for Virtual Visits (Telemedicine).  Patients are able to view lab/test results, encounter notes, upcoming appointments, etc.  Non-urgent messages can be sent to your provider as well.   To learn more about what you can do with MyChart, go to [email or web address].    Your next appointment:   9 month(s)  The format for your next appointment:  In Person  Provider:   Berniece Salines, DO     Other  Instructions   Important Information About Sugar         Adopting a Healthy Lifestyle.  Know what a healthy weight is for you (roughly BMI <25) and aim to maintain this   Aim for 7+ servings of fruits and vegetables daily   65-80+ fluid ounces of water or unsweet tea for healthy kidneys   Limit to max 1 drink of alcohol per day; avoid smoking/tobacco   Limit animal fats in diet for cholesterol and heart health - choose grass fed whenever available   Avoid highly processed foods, and foods high in saturated/trans fats   Aim for low stress - take time to unwind and care for your mental health   Aim for 150 min of moderate intensity exercise weekly for heart health, and weights twice weekly for bone health   Aim for 7-9 hours of sleep daily   When it comes to diets, agreement about the perfect plan isnt easy to find, even among the experts. Experts at the [employer] developed an idea known as the Healthy Eating Plate. Just imagine a plate divided into logical, healthy portions.   The emphasis is on diet quality:   Load up on vegetables and fruits - one-half of your plate: Aim for color and variety, and remember that potatoes dont count.   Go for whole grains - one-quarter of your plate: Whole wheat, barley, wheat berries, quinoa, oats, brown rice, and foods made with them. If you want pasta, go with whole wheat pasta.   Protein power - one-quarter of your plate: Fish, chicken, beans, and nuts are all healthy, versatile protein sources. Limit red meat.   The diet, however, does go beyond the plate, offering a few other suggestions.   Use healthy plant oils, such as olive, canola, soy, corn, sunflower and peanut. Check the labels, and avoid partially hydrogenated oil, which have unhealthy trans fats.   If youre thirsty, drink water. Coffee and tea are good in moderation, but skip sugary drinks and limit milk and dairy products to one or two daily  servings.   The type of carbohydrate in the diet is more important than the amount. Some sources of carbohydrates, such as vegetables, fruits, whole grains, and beans-are healthier than others.   Finally, stay active  Signed, Berniece Salines, DO  09/25/2021 9:42 AM    [place]

## 2021-09-25 NOTE — Patient Instructions (Signed)
Medication Instructions:  Your physician recommends that you continue on your current medications as directed. Please refer to the Current Medication list given to you today.  *If you need a refill on your cardiac medications before your next appointment, please call your pharmacy*   Lab Work: None If you have labs (blood work) drawn today and your tests are completely normal, you will receive your results only by: [place] (if you have MyChart) OR A paper copy in the mail If you have any lab test that is abnormal or we need to change your treatment, we will call you to review the results.   Testing/Procedures: None   Follow-Up: At [facility], you and your health needs are our priority.  As part of our continuing mission to provide you with exceptional heart care, we have created designated Provider Care Teams.  These Care Teams include your primary Cardiologist (physician) and Advanced Practice Providers (APPs -  Physician Assistants and Nurse Practitioners) who all work together to provide you with the care you need, when you need it.  We recommend signing up for the patient portal called "MyChart".  Sign up information is provided on this After Visit Summary.  MyChart is used to connect with patients for Virtual Visits (Telemedicine).  Patients are able to view lab/test results, encounter notes, upcoming appointments, etc.  Non-urgent messages can be sent to your provider as well.   To learn more about what you can do with MyChart, go to [email or web address].    Your next appointment:   9 month(s)  The format for your next appointment:   In Person  Provider:   Berniece Salines, DO     Other Instructions   Important Information About Sugar

## 2021-09-29 DIAGNOSIS — M940 Chondrocostal junction syndrome [Tietze]: Secondary | ICD-10-CM | POA: Diagnosis not present

## 2021-09-29 DIAGNOSIS — J301 Allergic rhinitis due to pollen: Secondary | ICD-10-CM | POA: Diagnosis not present

## 2021-09-29 DIAGNOSIS — R0602 Shortness of breath: Secondary | ICD-10-CM | POA: Diagnosis not present

## 2021-09-29 DIAGNOSIS — J3089 Other allergic rhinitis: Secondary | ICD-10-CM | POA: Diagnosis not present

## 2021-09-29 DIAGNOSIS — J3081 Allergic rhinitis due to animal (cat) (dog) hair and dander: Secondary | ICD-10-CM | POA: Diagnosis not present

## 2021-11-06 DIAGNOSIS — Z23 Encounter for immunization: Secondary | ICD-10-CM | POA: Diagnosis not present

## 2021-11-15 ENCOUNTER — Telehealth: Payer: Self-pay | Admitting: Licensed Clinical Social Worker

## 2021-11-15 ENCOUNTER — Ambulatory Visit: Payer: Self-pay | Admitting: Licensed Clinical Social Worker

## 2021-11-15 NOTE — Patient Outreach (Signed)
  Care Coordination   Initial Visit Note   11/15/2021 Name: SHERRIA RIEMANN MRN: [id number] DOB: Nov 18, 1945  CAMARIA GERALD is a 76 y.o. year old female who sees Hamrick, Lorin Mercy, MD for primary care. I spoke with  Shary Key by phone today.  What matters to the patients health and wellness today?  Patient declined services at this time.    Goals Addressed               This Visit's Progress     Care Coordination Activities - No follow up needed (pt-stated)        Care Coordination Interventions: Assessed social determinant of health barriers  Provided SW Contact information if needed in the future. Patient declined services at this time.        SDOH assessments and interventions completed:  Yes     Care Coordination Interventions Activated:  Yes  Care Coordination Interventions:  Yes, provided   Follow up plan: No further intervention required.   Encounter Outcome:  Pt. Visit Completed   Lenor Derrick , MSW Social Worker [facility] Care Management  [phone]

## 2021-11-15 NOTE — Patient Instructions (Signed)
Visit Information  Thank you for taking time to visit with me today. Please don't hesitate to contact me if I can be of assistance to you.   Following are the goals we discussed today:   Goals Addressed               This Visit's Progress     Care Coordination Activities - No follow up needed (pt-stated)        Care Coordination Interventions: Assessed social determinant of health barriers  Provided SW Contact information if needed in the future. Patient declined services at this time.          Please call the care guide team at [phone] if you need additional assistance.  If you are experiencing a Mental Health or [place] or need someone to talk to, please call the Suicide and Crisis Lifeline: 988   Patient verbalizes understanding of instructions and care plan provided today and agrees to view in [place]. Active MyChart status and patient understanding of how to access instructions and care plan via MyChart confirmed with patient.     No further follow up required: .  Lenor Derrick, MSW  Social Worker [facility] Care Management  [phone]

## 2021-11-15 NOTE — Patient Outreach (Signed)
  Care Coordination   11/15/2021 Name: Holly Nixon MRN: [id number] DOB: 12/18/1945   Care Coordination Outreach Attempts:  An unsuccessful telephone outreach was attempted today to offer the patient information about available care coordination services as a benefit of their health plan.   Follow Up Plan:  Additional outreach attempts will be made to offer the patient care coordination information and services.   Encounter Outcome:  No Answer  Care Coordination Interventions Activated:  No   Care Coordination Interventions:  No, not indicated    Lenor Derrick , MSW Social Worker [facility] Care Management  [phone]

## 2021-11-24 DIAGNOSIS — Z23 Encounter for immunization: Secondary | ICD-10-CM | POA: Diagnosis not present

## 2021-11-29 DIAGNOSIS — N1831 Chronic kidney disease, stage 3a: Secondary | ICD-10-CM | POA: Diagnosis not present

## 2021-11-29 DIAGNOSIS — E782 Mixed hyperlipidemia: Secondary | ICD-10-CM | POA: Diagnosis not present

## 2021-12-01 DIAGNOSIS — D7581 Myelofibrosis: Secondary | ICD-10-CM | POA: Diagnosis not present

## 2021-12-01 DIAGNOSIS — N1831 Chronic kidney disease, stage 3a: Secondary | ICD-10-CM | POA: Diagnosis not present

## 2021-12-01 DIAGNOSIS — I10 Essential (primary) hypertension: Secondary | ICD-10-CM | POA: Diagnosis not present

## 2021-12-01 DIAGNOSIS — M79651 Pain in right thigh: Secondary | ICD-10-CM | POA: Diagnosis not present

## 2021-12-01 DIAGNOSIS — E782 Mixed hyperlipidemia: Secondary | ICD-10-CM | POA: Diagnosis not present

## 2021-12-06 DIAGNOSIS — D692 Other nonthrombocytopenic purpura: Secondary | ICD-10-CM | POA: Diagnosis not present

## 2021-12-06 DIAGNOSIS — L718 Other rosacea: Secondary | ICD-10-CM | POA: Diagnosis not present

## 2021-12-06 DIAGNOSIS — L821 Other seborrheic keratosis: Secondary | ICD-10-CM | POA: Diagnosis not present

## 2021-12-06 DIAGNOSIS — L570 Actinic keratosis: Secondary | ICD-10-CM | POA: Diagnosis not present

## 2021-12-06 DIAGNOSIS — L820 Inflamed seborrheic keratosis: Secondary | ICD-10-CM | POA: Diagnosis not present

## 2021-12-06 DIAGNOSIS — L710 Perioral dermatitis: Secondary | ICD-10-CM | POA: Diagnosis not present

## 2022-05-10 ENCOUNTER — Telehealth: Payer: Self-pay

## 2022-05-10 NOTE — Patient Outreach (Signed)
  Care Coordination   Initial Visit Note   05/10/2022 Name: SHANDIA PORRATA MRN: [id number] DOB: [birth year]  SHLEY PICKLE is a 77 y.o. year old female who sees Hamrick, Lorin Mercy, MD for primary care. I spoke with  Shary Key by phone today.  What matters to the patients health and wellness today?  Placed call to patient today to review and offer [facility] care coordination program. Patient reports that she is doing well and denies any needs today.       SDOH assessments and interventions completed:  No     Care Coordination Interventions:  No, not indicated   Follow up plan: No further intervention required.   Encounter Outcome:  Pt. Refused   Tomasa Rand, RN, BSN, CEN [facility] [employer] [phone]

## 2022-07-04 DIAGNOSIS — N1831 Chronic kidney disease, stage 3a: Secondary | ICD-10-CM | POA: Diagnosis not present

## 2022-07-04 DIAGNOSIS — E782 Mixed hyperlipidemia: Secondary | ICD-10-CM | POA: Diagnosis not present

## 2022-07-06 DIAGNOSIS — D7581 Myelofibrosis: Secondary | ICD-10-CM | POA: Diagnosis not present

## 2022-07-06 DIAGNOSIS — I10 Essential (primary) hypertension: Secondary | ICD-10-CM | POA: Diagnosis not present

## 2022-07-06 DIAGNOSIS — E782 Mixed hyperlipidemia: Secondary | ICD-10-CM | POA: Diagnosis not present

## 2022-07-06 DIAGNOSIS — Z6825 Body mass index (BMI) 25.0-25.9, adult: Secondary | ICD-10-CM | POA: Diagnosis not present

## 2022-07-06 DIAGNOSIS — N1831 Chronic kidney disease, stage 3a: Secondary | ICD-10-CM | POA: Diagnosis not present

## 2022-07-06 DIAGNOSIS — Z9181 History of falling: Secondary | ICD-10-CM | POA: Diagnosis not present

## 2022-07-06 DIAGNOSIS — Z139 Encounter for screening, unspecified: Secondary | ICD-10-CM | POA: Diagnosis not present

## 2022-07-06 DIAGNOSIS — Z1331 Encounter for screening for depression: Secondary | ICD-10-CM | POA: Diagnosis not present

## 2022-07-12 DIAGNOSIS — J3089 Other allergic rhinitis: Secondary | ICD-10-CM | POA: Diagnosis not present

## 2022-07-12 DIAGNOSIS — M940 Chondrocostal junction syndrome [Tietze]: Secondary | ICD-10-CM | POA: Diagnosis not present

## 2022-07-12 DIAGNOSIS — J301 Allergic rhinitis due to pollen: Secondary | ICD-10-CM | POA: Diagnosis not present

## 2022-07-12 DIAGNOSIS — J3081 Allergic rhinitis due to animal (cat) (dog) hair and dander: Secondary | ICD-10-CM | POA: Diagnosis not present

## 2022-08-06 DIAGNOSIS — H353131 Nonexudative age-related macular degeneration, bilateral, early dry stage: Secondary | ICD-10-CM | POA: Diagnosis not present

## 2022-08-06 DIAGNOSIS — H25813 Combined forms of age-related cataract, bilateral: Secondary | ICD-10-CM | POA: Diagnosis not present

## 2022-08-06 DIAGNOSIS — H5202 Hypermetropia, left eye: Secondary | ICD-10-CM | POA: Diagnosis not present

## 2022-08-06 DIAGNOSIS — H5211 Myopia, right eye: Secondary | ICD-10-CM | POA: Diagnosis not present

## 2022-08-26 ENCOUNTER — Other Ambulatory Visit: Payer: Self-pay | Admitting: Cardiology

## 2022-08-29 DIAGNOSIS — Z1231 Encounter for screening mammogram for malignant neoplasm of breast: Secondary | ICD-10-CM | POA: Diagnosis not present

## 2022-10-11 ENCOUNTER — Encounter: Payer: Self-pay | Admitting: Cardiology

## 2022-10-11 ENCOUNTER — Ambulatory Visit: Payer: Medicare Other | Attending: Cardiology | Admitting: Cardiology

## 2022-10-11 VITALS — BP 146/80 | HR 60 | Ht 62.0 in | Wt 141.6 lb

## 2022-10-11 DIAGNOSIS — E782 Mixed hyperlipidemia: Secondary | ICD-10-CM | POA: Insufficient documentation

## 2022-10-11 DIAGNOSIS — I10 Essential (primary) hypertension: Secondary | ICD-10-CM | POA: Diagnosis not present

## 2022-10-11 NOTE — Patient Instructions (Signed)
Medication Instructions:  Your physician recommends that you continue on your current medications as directed. Please refer to the Current Medication list given to you today.  *If you need a refill on your cardiac medications before your next appointment, please call your pharmacy*   Lab Work: None   Testing/Procedures: None   Follow-Up: At [facility], you and your health needs are our priority.  As part of our continuing mission to provide you with exceptional heart care, we have created designated Provider Care Teams.  These Care Teams include your primary Cardiologist (physician) and Advanced Practice Providers (APPs -  Physician Assistants and Nurse Practitioners) who all work together to provide you with the care you need, when you need it.   Your next appointment:   1 year(s)  Provider:   Kardie Tobb, DO   

## 2022-10-11 NOTE — Progress Notes (Signed)
Cardiology Office Note:    Date:  10/11/2022   ID:  Holly Nixon, DOB 02-15-1946, MRN [id number]  PCP:  Ailene Ravel, MD  Cardiologist:  Thomasene Ripple, DO  Electrophysiologist:  None   Referring MD: Ailene Ravel, MD   " I am doing ok"  History of Present Illness:    Holly Nixon is a 77 y.o. female with a hx of hypertension, hypercholesteremia, hyperlipidemia sleep apnea here today for follow-up visit.  At her last visit and reviewed all of her blood pressure at home and did not change any medication.  Today she brought her blood pressure reading at home and I was able to review that.  They are all within target.  She has no other complaints today.  She had questions about being started on Wegovy explained to the patient that clinically she does not meet criteria.   Past Medical History:  Diagnosis Date   Allergy    SEASONAL   Anxiety    Arthritis    back,hands   Asthma    took shots   Bilateral sensorineural hearing loss 07/14/2019   Breast lump 08/29/2020   Chest pain, unspecified 09/27/2020   Chronic kidney disease    Stage 3/ moderate   Depression    Environmental allergies    Essential hypertension 06/12/2015   Fatigue 06/29/2015   Glaucoma    Hypercholesterolemia    Myelofibrosis (HCC)    Rectocele    Sleep apnea, obstructive 06/29/2015   Has CPAP    Past Surgical History:  Procedure Laterality Date   ABDOMINAL HYSTERECTOMY  1992   total   COLONOSCOPY     TUBAL LIGATION      Current Medications: Current Meds  Medication Sig   acyclovir (ZOVIRAX) 400 MG tablet Take 400 mg by mouth every other day. Monday , Wednesday and Friday   cholecalciferol (VITAMIN D3) 25 MCG (1000 UNIT) tablet Take 1,000 Units by mouth daily.   Cyanocobalamin (VITAMIN B-12 PO) Take by mouth.   estradiol (ESTRACE) 0.5 MG tablet Take 0.5 mg by mouth daily.   hydrochlorothiazide (HYDRODIURIL) 25 MG tablet TAKE 1 TABLET DAILY   Krill Oil 500 MG CAPS Take 500 mg by mouth  daily.   levocetirizine (XYZAL) 5 MG tablet Take 5 mg by mouth every evening.   rosuvastatin (CRESTOR) 10 MG tablet Take 1 tablet (10 mg total) by mouth daily.     Allergies:   Aspirin, Nsaids, Other, Penicillins, and Sulfonamide derivatives   Social History   Socioeconomic History   Marital status: Married    Spouse name: Not on file   Number of children: Not on file   Years of education: Not on file   Highest education level: Not on file  Occupational History   Not on file  Tobacco Use   Smoking status: Former    Current packs/day: 0.00    Types: Cigarettes    Quit date: 03/05/1978    Years since quitting: 44.6   Smokeless tobacco: Never  Vaping Use   Vaping status: Never Used  Substance and Sexual Activity   Alcohol use: Yes    Alcohol/week: 2.0 standard drinks of alcohol    Types: 2 Shots of liquor per week   Drug use: No   Sexual activity: Not on file  Other Topics Concern   Not on file  Social History Narrative   Not on file   Social Determinants of Health   Financial Resource Strain: Not on file  Food Insecurity: No Food Insecurity (11/15/2021)   Hunger Vital Sign    Worried About Running Out of Food in the Last Year: Never true    Ran Out of Food in the Last Year: Never true  Transportation Needs: No Transportation Needs (11/15/2021)   PRAPARE - Administrator, Civil Service (Medical): No    Lack of Transportation (Non-Medical): No  Physical Activity: Sufficiently Active (05/04/2021)   [facility]   Exercise Vital Sign    Days of Exercise per Week: 5 days    Minutes of Exercise per Session: 30 min  Stress: Not on file  Social Connections: Not on file     Family History: The patient's family history includes Colon cancer in her mother; Congenital heart disease in her brother; Congestive Heart Failure in her sister; Coronary artery disease in her father; Dementia in her mother; Diabetes in her sister; Heart  attack in her father; Heart disease in her father; Lymphoma in her daughter; Stroke in her maternal grandmother and mother. There is no history of Esophageal cancer, Rectal cancer, or Stomach cancer.  ROS:   Review of Systems  Constitution: Negative for decreased appetite, fever and weight gain.  HENT: Negative for congestion, ear discharge, hoarse voice and sore throat.   Eyes: Negative for discharge, redness, vision loss in right eye and visual halos.  Cardiovascular: Negative for chest pain, dyspnea on exertion, leg swelling, orthopnea and palpitations.  Respiratory: Negative for cough, hemoptysis, shortness of breath and snoring.   Endocrine: Negative for heat intolerance and polyphagia.  Hematologic/Lymphatic: Negative for bleeding problem. Does not bruise/bleed easily.  Skin: Negative for flushing, nail changes, rash and suspicious lesions.  Musculoskeletal: Negative for arthritis, joint pain, muscle cramps, myalgias, neck pain and stiffness.  Gastrointestinal: Negative for abdominal pain, bowel incontinence, diarrhea and excessive appetite.  Genitourinary: Negative for decreased libido, genital sores and incomplete emptying.  Neurological: Negative for brief paralysis, focal weakness, headaches and loss of balance.  Psychiatric/Behavioral: Negative for altered mental status, depression and suicidal ideas.  Allergic/Immunologic: Negative for HIV exposure and persistent infections.    EKGs/Labs/Other Studies Reviewed:    The following studies were reviewed today:   EKG:  The ekg ordered today demonstrates sinus rhythm 60 bpm  Nuclear  stress test 9//2022   The study is normal. The study is low risk.   Left ventricular function is normal. Nuclear stress EF: 68 %. The left ventricular ejection fraction is hyperdynamic (>65%).   Prior study not available for comparison.   TTE 9/1/2022IMPRESSIONS   1. Left ventricular ejection fraction, by estimation, is 50 to 55%. The  left  ventricle has low normal function. The left ventricle has no regional  wall motion abnormalities. Left ventricular diastolic parameters are  consistent with Grade I diastolic  dysfunction (impaired relaxation). The average left ventricular global  longitudinal strain is -9.7 %. The global longitudinal strain is abnormal.   2. Right ventricular systolic function is normal. The right ventricular  size is normal. There is normal pulmonary artery systolic pressure.   3. The mitral valve is normal in structure. No evidence of mitral valve  regurgitation. No evidence of mitral stenosis.   4. The aortic valve is normal in structure. Aortic valve regurgitation is  not visualized. No aortic stenosis is present.   5. The inferior vena cava is normal in size with greater than 50%  respiratory variability, suggesting right atrial pressure of 3 mmHg.   Comparison(s): No prior  Echocardiogram.   FINDINGS   Left Ventricle: Left ventricular ejection fraction, by estimation, is 50  to 55%. The left ventricle has low normal function. The left ventricle has  no regional wall motion abnormalities. The average left ventricular global  longitudinal strain is -9.7 %.   The global longitudinal strain is abnormal. The left ventricular internal  cavity size was normal in size. There is no left ventricular hypertrophy.  Left ventricular diastolic parameters are consistent with Grade I  diastolic dysfunction (impaired relaxation).   Right Ventricle: The right ventricular size is normal. No increase in  right ventricular wall thickness. Right ventricular systolic function is  normal. There is normal pulmonary artery systolic pressure. The tricuspid  regurgitant velocity is 2.34 m/s, and   with an assumed right atrial pressure of 3 mmHg, the estimated right  ventricular systolic pressure is 24.9 mmHg.   Left Atrium: Left atrial size was normal in size.   Right Atrium: Right atrial size was normal in size.    Pericardium: There is no evidence of pericardial effusion.   Mitral Valve: The mitral valve is normal in structure. No evidence of  mitral valve regurgitation. No evidence of mitral valve stenosis.   Tricuspid Valve: The tricuspid valve is normal in structure. Tricuspid  valve regurgitation is not demonstrated. No evidence of tricuspid  stenosis.   Aortic Valve: The aortic valve is normal in structure. Aortic valve  regurgitation is not visualized. No aortic stenosis is present.   Pulmonic Valve: The pulmonic valve was normal in structure. Pulmonic valve  regurgitation is not visualized. No evidence of pulmonic stenosis.   Aorta: The aortic root is normal in size and structure.   Venous: The inferior vena cava is normal in size with greater than 50%  respiratory variability, suggesting right atrial pressure of 3 mmHg.   IAS/Shunts: No atrial level shunt detected by color flow Doppler.   Recent Labs: No results found for requested labs within last 365 days.  Recent Lipid Panel No results found for: "CHOL", "TRIG", "HDL", "CHOLHDL", "VLDL", "LDLCALC", "LDLDIRECT"  Physical Exam:    VS:  BP (!) 146/80 (BP Location: Left Arm, Patient Position: Sitting, Cuff Size: Normal)   Pulse 60   Ht 5\' 2"  (1.575 m)   Wt 141 lb 9.6 oz (64.2 kg)   SpO2 100%   BMI 25.90 kg/m     Wt Readings from Last 3 Encounters:  10/11/22 141 lb 9.6 oz (64.2 kg)  09/25/21 135 lb 9.6 oz (61.5 kg)  06/14/21 140 lb 3.2 oz (63.6 kg)     GEN: Well nourished, well developed in no acute distress HEENT: Normal NECK: No JVD; No carotid bruits LYMPHATICS: No lymphadenopathy CARDIAC: S1S2 noted,RRR, no murmurs, rubs, gallops RESPIRATORY:  Clear to auscultation without rales, wheezing or rhonchi  ABDOMEN: Soft, non-tender, non-distended, +bowel sounds, no guarding. EXTREMITIES: No edema, No cyanosis, no clubbing MUSCULOSKELETAL:  No deformity  SKIN: Warm and dry NEUROLOGIC:  Alert and oriented x 3,  non-focal PSYCHIATRIC:  Normal affect, good insight  ASSESSMENT:    1. Essential hypertension   2. Mixed hyperlipidemia    PLAN:    I was able to get her blood pressure manually which was slightly elevated in office.  And there is coincide with the blood pressure cuff from home that she brought.    We will continue the patient on hydrochlorothiazide.  Her blood pressure at home appears to be at target.  Slightly elevated here today which corresponded with watch on  her blood pressure reading today.  I would avoid increasing her antihypertensive medication to avoid hypotension.  In terms of her hyperlipidemia she had blood work on Jul 04, 2022 with her PCP ~cholesterol 161, HDL 71, LDL 73, triglyceride 92.  Will continue patient her rosuvastatin.  The patient is in agreement with the above plan. The patient left the office in stable condition.  The patient will follow up in 9 months    Medication Adjustments/Labs and Tests Ordered: Current medicines are reviewed at length with the patient today.  Concerns regarding medicines are outlined above.  Orders Placed This Encounter  Procedures   EKG 12-Lead   No orders of the defined types were placed in this encounter.   Patient Instructions  Medication Instructions:  Your physician recommends that you continue on your current medications as directed. Please refer to the Current Medication list given to you today.  *If you need a refill on your cardiac medications before your next appointment, please call your pharmacy*   Lab Work: None   Testing/Procedures: None   Follow-Up: At [facility], you and your health needs are our priority.  As part of our continuing mission to provide you with exceptional heart care, we have created designated Provider Care Teams.  These Care Teams include your primary Cardiologist (physician) and Advanced Practice Providers (APPs -  Physician Assistants and Nurse Practitioners) who all work  together to provide you with the care you need, when you need it.  Your next appointment:   1 year(s)  Provider:   Thomasene Ripple, DO     Adopting a Healthy Lifestyle.  Know what a healthy weight is for you (roughly BMI <25) and aim to maintain this   Aim for 7+ servings of fruits and vegetables daily   65-80+ fluid ounces of water or unsweet tea for healthy kidneys   Limit to max 1 drink of alcohol per day; avoid smoking/tobacco   Limit animal fats in diet for cholesterol and heart health - choose grass fed whenever available   Avoid highly processed foods, and foods high in saturated/trans fats   Aim for low stress - take time to unwind and care for your mental health   Aim for 150 min of moderate intensity exercise weekly for heart health, and weights twice weekly for bone health   Aim for 7-9 hours of sleep daily   When it comes to diets, agreement about the perfect plan isnt easy to find, even among the experts. Experts at the [facility] [employer] developed an idea known as the Healthy Eating Plate. Just imagine a plate divided into logical, healthy portions.   The emphasis is on diet quality:   Load up on vegetables and fruits - one-half of your plate: Aim for color and variety, and remember that potatoes dont count.   Go for whole grains - one-quarter of your plate: Whole wheat, barley, wheat berries, quinoa, oats, brown rice, and foods made with them. If you want pasta, go with whole wheat pasta.   Protein power - one-quarter of your plate: Fish, chicken, beans, and nuts are all healthy, versatile protein sources. Limit red meat.   The diet, however, does go beyond the plate, offering a few other suggestions.   Use healthy plant oils, such as olive, canola, soy, corn, sunflower and peanut. Check the labels, and avoid partially hydrogenated oil, which have unhealthy trans fats.   If youre thirsty, drink water. Coffee and tea are good in  moderation, but  skip sugary drinks and limit milk and dairy products to one or two daily servings.   The type of carbohydrate in the diet is more important than the amount. Some sources of carbohydrates, such as vegetables, fruits, whole grains, and beans-are healthier than others.   Finally, stay active  Signed, Thomasene Ripple, DO  10/11/2022 8:29 AM    [facility]

## 2022-10-31 DIAGNOSIS — Z23 Encounter for immunization: Secondary | ICD-10-CM | POA: Diagnosis not present

## 2022-11-07 DIAGNOSIS — Z9181 History of falling: Secondary | ICD-10-CM | POA: Diagnosis not present

## 2022-11-07 DIAGNOSIS — Z0000 Encounter for general adult medical examination without abnormal findings: Secondary | ICD-10-CM | POA: Diagnosis not present

## 2022-11-07 DIAGNOSIS — Z139 Encounter for screening, unspecified: Secondary | ICD-10-CM | POA: Diagnosis not present

## 2022-11-07 DIAGNOSIS — Z1331 Encounter for screening for depression: Secondary | ICD-10-CM | POA: Diagnosis not present

## 2022-11-08 ENCOUNTER — Other Ambulatory Visit: Payer: Self-pay | Admitting: Cardiology

## 2022-11-24 ENCOUNTER — Other Ambulatory Visit: Payer: Self-pay | Admitting: Cardiology

## 2022-12-11 DIAGNOSIS — L218 Other seborrheic dermatitis: Secondary | ICD-10-CM | POA: Diagnosis not present

## 2022-12-11 DIAGNOSIS — L438 Other lichen planus: Secondary | ICD-10-CM | POA: Diagnosis not present

## 2022-12-11 DIAGNOSIS — L570 Actinic keratosis: Secondary | ICD-10-CM | POA: Diagnosis not present

## 2022-12-11 DIAGNOSIS — L821 Other seborrheic keratosis: Secondary | ICD-10-CM | POA: Diagnosis not present

## 2022-12-11 DIAGNOSIS — D225 Melanocytic nevi of trunk: Secondary | ICD-10-CM | POA: Diagnosis not present

## 2023-01-14 DIAGNOSIS — Z79899 Other long term (current) drug therapy: Secondary | ICD-10-CM | POA: Diagnosis not present

## 2023-01-14 DIAGNOSIS — E782 Mixed hyperlipidemia: Secondary | ICD-10-CM | POA: Diagnosis not present

## 2023-01-14 DIAGNOSIS — N1831 Chronic kidney disease, stage 3a: Secondary | ICD-10-CM | POA: Diagnosis not present

## 2023-01-16 DIAGNOSIS — I10 Essential (primary) hypertension: Secondary | ICD-10-CM | POA: Diagnosis not present

## 2023-01-16 DIAGNOSIS — E782 Mixed hyperlipidemia: Secondary | ICD-10-CM | POA: Diagnosis not present

## 2023-01-16 DIAGNOSIS — D7581 Myelofibrosis: Secondary | ICD-10-CM | POA: Diagnosis not present

## 2023-01-16 DIAGNOSIS — K5900 Constipation, unspecified: Secondary | ICD-10-CM | POA: Diagnosis not present

## 2023-01-16 DIAGNOSIS — N1831 Chronic kidney disease, stage 3a: Secondary | ICD-10-CM | POA: Diagnosis not present

## 2023-01-21 DIAGNOSIS — K5909 Other constipation: Secondary | ICD-10-CM | POA: Diagnosis not present

## 2023-04-05 ENCOUNTER — Telehealth: Payer: Self-pay | Admitting: Cardiology

## 2023-04-05 NOTE — Telephone Encounter (Signed)
Pt c/o medication issue:  1. Name of Medication:   hydrochlorothiazide (HYDRODIURIL) 25 MG tablet   2. How are you currently taking this medication (dosage and times per day)?   As prescribed  3. Are you having a reaction (difficulty breathing--STAT)?  Severe constipation  4. What is your medication issue?   Patient stated she has been having severe constipation for about a month while taking this medication.  Patient stated her BP today was 140/74 which has been the highest in a long time.  Patient wants to know if an ace inhibitor could be an option for her.

## 2023-04-09 ENCOUNTER — Encounter: Payer: Self-pay | Admitting: Gastroenterology

## 2023-04-09 DIAGNOSIS — K5909 Other constipation: Secondary | ICD-10-CM | POA: Diagnosis not present

## 2023-04-09 DIAGNOSIS — R110 Nausea: Secondary | ICD-10-CM | POA: Diagnosis not present

## 2023-04-09 DIAGNOSIS — K5900 Constipation, unspecified: Secondary | ICD-10-CM | POA: Diagnosis not present

## 2023-04-09 DIAGNOSIS — R634 Abnormal weight loss: Secondary | ICD-10-CM | POA: Diagnosis not present

## 2023-04-09 DIAGNOSIS — R195 Other fecal abnormalities: Secondary | ICD-10-CM | POA: Diagnosis not present

## 2023-04-16 ENCOUNTER — Other Ambulatory Visit: Payer: Self-pay

## 2023-04-16 DIAGNOSIS — R634 Abnormal weight loss: Secondary | ICD-10-CM | POA: Diagnosis not present

## 2023-04-16 NOTE — Telephone Encounter (Signed)
Medication list updated to reflect discontinuing the medication.

## 2023-04-19 DIAGNOSIS — E871 Hypo-osmolality and hyponatremia: Secondary | ICD-10-CM | POA: Diagnosis not present

## 2023-04-22 ENCOUNTER — Telehealth: Payer: Self-pay | Admitting: Gastroenterology

## 2023-04-22 NOTE — Telephone Encounter (Signed)
 Inbound call from patients states she has not been able to pass a bowl movement. Patient also states she is having unintentional weight loss as well. Last seen in 2020. Patient is scheduled for next available f/u.   Please advise. Thank you

## 2023-04-22 NOTE — Telephone Encounter (Signed)
 Patient is due her colonoscopy in June this year. She has been experiencing constipation. She has seen her PCP about this and was told to try Miralax and increase her dietary fiber. The patient has been taking 1/2 to 1 dose of Miralax on most days. She has increased her fluid intake. She is scheduled for evaluation at the end of March. She is agreeable to increasing Miralax to BID. If there is a cancellation on any schedule, I can bring her in earlier. Please advise.

## 2023-04-26 NOTE — Telephone Encounter (Signed)
 Agree, thank you

## 2023-05-21 ENCOUNTER — Ambulatory Visit: Payer: Medicare Other | Admitting: Gastroenterology

## 2023-05-22 NOTE — Progress Notes (Unsigned)
 Chief Complaint:chronic constipation Primary GI Doctor:Dr. Lavon Paganini  HPI:  Patient is a 78 year old female patient with past medical history of hypertension, anxiety, depression, chronic kidney disease, who was referred to me by Holly Ravel, MD on 04/12/23 for a complaint of chronic constipation .    Interval History     Patient presents with main complaint of altered bowel bowels, nausea, and weight loss. Patient reports she is currently taking Miralax once to twice daily for constipation. She states she will have 1-5 loose stools per day described as little squiggly stools. Patient denies abdominal pain or blood in stool.  Patient states her hydrochlorothiazide was recently changed to amlodipine due to electrolyte imbalances with potassium and sodium. She had concerns the frequent diarrhea and hydrochlorothiazide contributed to these and would like rechecked today.    She also reports she has lost 14lbs since August 2024, she states she thinks it is due to fear of it worsening her constipation. She has been consuming smaller meals throughout the day.    Lastly, she has had increased nausea of the past 6 weeks.She uses ondansetron as needed, which she states she has only taken it 3 times. Denies GERD or dysphagia.   Wt Readings from Last 3 Encounters:  05/23/23 127 lb 12.8 oz (58 kg)  10/11/22 141 lb 9.6 oz (64.2 kg)  09/25/21 135 lb 9.6 oz (61.5 kg)    Past Medical History:  Diagnosis Date   Allergy    SEASONAL   Anxiety    Arthritis    back,hands   Asthma    took shots   Bilateral sensorineural hearing loss 07/14/2019   Breast lump 08/29/2020   Chest pain, unspecified 09/27/2020   Chronic kidney disease    Stage 3/ moderate   Depression    Environmental allergies    Essential hypertension 06/12/2015   Fatigue 06/29/2015   Glaucoma    Hypercholesterolemia    Myelofibrosis (HCC)    Rectocele    Sleep apnea, obstructive 06/29/2015   Has CPAP    Past Surgical History:   Procedure Laterality Date   ABDOMINAL HYSTERECTOMY  1992   total   COLONOSCOPY     TUBAL LIGATION      Current Outpatient Medications  Medication Sig Dispense Refill   acyclovir (ZOVIRAX) 400 MG tablet Take 400 mg by mouth every other day. Monday , Wednesday and Friday     amLODipine (NORVASC) 5 MG tablet Take 5 mg by mouth daily.     cholecalciferol (VITAMIN D3) 25 MCG (1000 UNIT) tablet Take 1,000 Units by mouth daily.     Cyanocobalamin (VITAMIN B-12 PO) Take by mouth.     estradiol (ESTRACE) 0.5 MG tablet Take 0.5 mg by mouth daily.     levocetirizine (XYZAL) 5 MG tablet Take 5 mg by mouth every evening.     Olopatadine HCl (PATADAY) 0.2 % SOLN as needed.     ondansetron (ZOFRAN-ODT) 4 MG disintegrating tablet Take 4 mg by mouth 3 (three) times daily as needed.     polyethylene glycol (MIRALAX / GLYCOLAX) 17 g packet Take 17 g by mouth daily. Once to twice a day as needed     rosuvastatin (CRESTOR) 10 MG tablet TAKE 1 TABLET DAILY 90 tablet 3   SUFLAVE 178.7 g SOLR Take 1 kit by mouth once for 1 dose. 1 each 0   nitroGLYCERIN (NITROSTAT) 0.4 MG SL tablet Place 0.4 mg under the tongue every 5 (five) minutes as needed for  chest pain. (Patient not taking: Reported on 10/11/2022)     No current facility-administered medications for this visit.   Allergies as of 05/23/2023 - Review Complete 05/23/2023  Allergen Reaction Noted   Aspirin Anaphylaxis 04/08/2008   Nsaids Anaphylaxis 04/08/2008   Other Hives 08/08/2018   Penicillins  04/08/2008   Sulfonamide derivatives  04/08/2008   Family History  Problem Relation Age of Onset   Cancer Mother        appendix   Stroke Mother    Dementia Mother    Heart disease Father    Heart attack Father    Coronary artery disease Father    Congestive Heart Failure Sister    Diabetes Sister    Congenital heart disease Brother    Stroke Maternal Grandmother    Lymphoma Daughter    Esophageal cancer Neg Hx    Rectal cancer Neg Hx    Stomach  cancer Neg Hx    Colon cancer Neg Hx     Review of Systems:    Constitutional: No weight loss, fever, chills, weakness or fatigue HEENT: Eyes: No change in vision               Ears, Nose, Throat:  No change in hearing or congestion Skin: No rash or itching Cardiovascular: No chest pain, chest pressure or palpitations   Respiratory: No SOB or cough Gastrointestinal: See HPI and otherwise negative Genitourinary: No dysuria or change in urinary frequency Neurological: No headache, dizziness or syncope Musculoskeletal: No new muscle or joint pain Hematologic: No bleeding or bruising Psychiatric: No history of depression or anxiety    Physical Exam:  Vital signs: BP 138/62   Pulse 86   Ht 5\' 2"  (1.575 m)   Wt 127 lb 12.8 oz (58 kg)   SpO2 99%   BMI 23.37 kg/m   Constitutional:   Pleasant  female appears to be in NAD, Well developed, Well nourished, alert and cooperative Throat: Oral cavity and pharynx without inflammation, swelling or lesion.  Respiratory: Respirations even and unlabored. Lungs clear to auscultation bilaterally.   No wheezes, crackles, or rhonchi.  Cardiovascular: Normal S1, S2. Regular rate and rhythm. No peripheral edema, cyanosis or pallor.  Gastrointestinal:  Soft, nondistended, nontender. No rebound or guarding. Normal bowel sounds. No appreciable masses or hepatomegaly. Rectal:  Not performed.  Msk:  Symmetrical without gross deformities. Without edema, no deformity or joint abnormality.  Neurologic:  Alert and  oriented x4;  grossly normal neurologically.  Skin:   Dry and intact without significant lesions or rashes. Psychiatric: Oriented to person, place and time. Demonstrates good judgement and reason without abnormal affect or behaviors.  RELEVANT LABS AND IMAGING: CBC    Latest Ref Rng & Units 06/09/2015    6:08 PM 07/18/2005    9:09 AM  CBC  WBC 4.0 - 10.5 K/uL 5.1  4.3   Hemoglobin 12.0 - 15.0 g/dL 40.9  81.1   Hematocrit 36.0 - 46.0 % 41.7   41.4   Platelets 150 - 400 K/uL 224  250      CMP     Latest Ref Rng & Units 06/09/2015    6:08 PM 07/18/2005    9:09 AM  CMP  Glucose 65 - 99 mg/dL 914  96   BUN 6 - 20 mg/dL 22  27   Creatinine 7.82 - 1.00 mg/dL 9.56  1.2   Sodium 213 - 145 mmol/L 142  140   Potassium 3.5 - 5.1 mmol/L 3.3  4.1   Chloride 101 - 111 mmol/L 104  103   CO2 22 - 32 mmol/L 23  27   Calcium 8.9 - 10.3 mg/dL 08.6  57.8   Total Protein 6.0 - 8.3 g/dL  7.3   Total Bilirubin 0.3 - 1.2 mg/dL  0.7   Alkaline Phos 39 - 117 U/L  50   AST 0 - 37 U/L  27   ALT 0 - 40 U/L  24      Lab Results  Component Value Date   TSH 1.83 06/10/2015   11/03/20 echo-Left ventricular ejection fraction, by estimation, is 50 to 55%.  08/22/2018 colonoscopy with Dr. Lavon Paganini, recall 5 years Impression:  - One 1 mm polyp in the ascending colon, removed with a cold biopsy forceps. Resected and retrieved.  - Diverticulosis in the sigmoid colon.  - Non- bleeding internal hemorrhoids. Path: Surgical [P], colon, ascending, polyp - BENIGN POLYPOID COLORECTAL MUCOSA WITH A LYMPHOID AGGREGATE. - THERE IS NO EVIDENCE OF MALIGNANCY. 05/20/2013 colonoscopy with Dr. Juanda Chance Endoscopic impression: Normal colonoscopy 2/18/20210 colonoscopy with Dr. Juanda Chance Endoscopic impression: Normal colonoscopy 04/21/1998 colonoscopy with Dr. Kelle Darting Normal colonoscopy 04/09/23 abdominal x-ray 1 view Impression Nonobstructive bowel gas pattern.  Moderate stool burden. 04/17/2023 CT scan abdomen pelvis with IV contrast Impression no acute intra-abdominal process  Assessment: Encounter Diagnoses  Name Primary?   History of colonic polyps    Family history of colon cancer    Chronic idiopathic constipation    Loss of weight Yes   Nausea without vomiting      78 year old female patient that presents with chronic constipation.  Patient has not had adequate control with MiraLAX once to twice daily and reports it causes diarrhea with multiple stools  throughout the day.  Patient reports she has had worsening nausea associated with the constipation and admits a fear of eating.  Recent abd x-ray shows moderate stool burden and I suspect her symptoms are related to obstipation.  CT scan abdomen and pelvis unremarkable.  Discussed doing a bowel purge.  Patient would like to go ahead and proceed with her colon screening colonoscopy with history of colonic polyps and family history of appendix/colon cancer in her mother.  Will also recheck her electrolytes due to concerns with recent abnormalities.  Plan: - recheck CBC, BMP, Magnesium today -antiemetics as needed -Continue miralax, titrate as needed. -Schedule for a colonoscopy in [facility] with Dr. Lavon Paganini. The risks and benefits of colonoscopy with possible polypectomy / biopsies were discussed and the patient agrees to proceed.    Thank you for the courtesy of this consult. Please call me with any questions or concerns.   Holly Mower, FNP-C [facility] 05/23/2023, 9:44 AM  Cc: Holly Ravel, MD

## 2023-05-23 ENCOUNTER — Other Ambulatory Visit

## 2023-05-23 ENCOUNTER — Ambulatory Visit: Payer: Medicare Other | Admitting: Gastroenterology

## 2023-05-23 ENCOUNTER — Encounter: Payer: Self-pay | Admitting: Gastroenterology

## 2023-05-23 VITALS — BP 138/62 | HR 86 | Ht 62.0 in | Wt 127.8 lb

## 2023-05-23 DIAGNOSIS — Z860100 Personal history of colon polyps, unspecified: Secondary | ICD-10-CM

## 2023-05-23 DIAGNOSIS — R634 Abnormal weight loss: Secondary | ICD-10-CM

## 2023-05-23 DIAGNOSIS — Z800 Family history of malignant neoplasm of digestive organs: Secondary | ICD-10-CM

## 2023-05-23 DIAGNOSIS — K5904 Chronic idiopathic constipation: Secondary | ICD-10-CM

## 2023-05-23 DIAGNOSIS — R110 Nausea: Secondary | ICD-10-CM | POA: Diagnosis not present

## 2023-05-23 LAB — CBC WITH DIFFERENTIAL/PLATELET
Basophils Absolute: 0 10*3/uL (ref 0.0–0.1)
Basophils Relative: 1 % (ref 0.0–3.0)
Eosinophils Absolute: 0.1 10*3/uL (ref 0.0–0.7)
Eosinophils Relative: 1.5 % (ref 0.0–5.0)
HCT: 41.9 % (ref 36.0–46.0)
Hemoglobin: 14.2 g/dL (ref 12.0–15.0)
Lymphocytes Relative: 19.5 % (ref 12.0–46.0)
Lymphs Abs: 0.7 10*3/uL (ref 0.7–4.0)
MCHC: 33.7 g/dL (ref 30.0–36.0)
MCV: 92.4 fl (ref 78.0–100.0)
Monocytes Absolute: 0.4 10*3/uL (ref 0.1–1.0)
Monocytes Relative: 9.8 % (ref 3.0–12.0)
Neutro Abs: 2.6 10*3/uL (ref 1.4–7.7)
Neutrophils Relative %: 68.2 % (ref 43.0–77.0)
Platelets: 197 10*3/uL (ref 150.0–400.0)
RBC: 4.54 Mil/uL (ref 3.87–5.11)
RDW: 14.4 % (ref 11.5–15.5)
WBC: 3.8 10*3/uL — ABNORMAL LOW (ref 4.0–10.5)

## 2023-05-23 LAB — BASIC METABOLIC PANEL
BUN: 12 mg/dL (ref 6–23)
CO2: 28 meq/L (ref 19–32)
Calcium: 10.1 mg/dL (ref 8.4–10.5)
Chloride: 106 meq/L (ref 96–112)
Creatinine, Ser: 0.92 mg/dL (ref 0.40–1.20)
GFR: 59.71 mL/min — ABNORMAL LOW (ref >60.00)
Glucose, Bld: 100 mg/dL — ABNORMAL HIGH (ref 70–99)
Potassium: 4.4 meq/L (ref 3.5–5.1)
Sodium: 140 meq/L (ref 135–145)

## 2023-05-23 LAB — MAGNESIUM: Magnesium: 2.3 mg/dL (ref 1.5–2.5)

## 2023-05-23 MED ORDER — SUFLAVE 178.7 G PO SOLR: 1.0000 | Freq: Once | ORAL | 0 refills | Status: AC

## 2023-05-23 NOTE — Patient Instructions (Addendum)
 _______________________________________________________  If your blood pressure at your visit was 140/90 or greater, please contact your primary care physician to follow up on this. _______________________________________________________  If you are age 80 or older, your body mass index should be between 23-30. Your Body mass index is 23.37 kg/m. If this is out of the aforementioned range listed, please consider follow up with your Primary Care Provider. ________________________________________________________  The [facility] providers would like to encourage you to use [facility] to communicate with providers for non-urgent requests or questions.  Due to long hold times on the telephone, sending your provider a message by [facility] may be a faster and more efficient way to get a response.  Please allow 48 business hours for a response.  Please remember that this is for non-urgent requests.  _______________________________________________________  Your provider has requested that you go to the basement level for lab work before leaving today. Press "B" on the elevator. The lab is located at the first door on the left as you exit the elevator.  You have been scheduled for a colonoscopy. Please follow written instructions given to you at your visit today.   If you use inhalers (even only as needed), please bring them with you on the day of your procedure.  DO NOT TAKE 7 DAYS PRIOR TO TEST- Trulicity (dulaglutide) Ozempic, Wegovy (semaglutide) Mounjaro (tirzepatide) Bydureon Bcise (exanatide extended release)  DO NOT TAKE 1 DAY PRIOR TO YOUR TEST Rybelsus (semaglutide) Adlyxin (lixisenatide) Victoza (liraglutide) Byetta (exanatide) ___________________________________________________________________________  Bonita Quin will receive your bowel preparation through [facility], which ensures the lowest copay and home delivery, with outreach via text or call from an [phone] number. Please respond promptly to  avoid rescheduling of your procedure. If you are interested in alternative options or have any questions regarding your prep, please contact them at [phone] ____________________________________________________________________________  Your Provider Has Sent Your Bowel Prep Regimen To [facility]   [facility] will contact you to verify your information and collect your copay, if applicable. Enjoy the comfort of your home while your prescription is mailed to you, FREE of any shipping charges.   [facility] accepts all major insurance benefits and applies discounts & coupons.  Have additional questions?   Chat: [email or web address] Call: [phone] Email: [email or web address] NCPDP: [id number]  How will [facility] contact you?  With a Welcome phone call,  a Welcome text and a checkout link in text form.  Texts you receive from [phone] Are NOT Spam.  *To set up delivery, you must complete the checkout process via link or speak to one of the patient care representatives. If [facility] is unable to reach you, your prescription may be delayed.  To avoid long hold times on the phone, you may also utilize the secure chat feature on the [facility] website to request that they call you back for transaction completion or to expedite your concerns.   Due to recent changes in healthcare laws, you may see the results of your imaging and laboratory studies on MyChart before your provider has had a chance to review them.  We understand that in some cases there may be results that are confusing or concerning to you. Not all laboratory results come back in the same time frame and the provider may be waiting for multiple results in order to interpret others.  Please give [place] 48 hours in order for your provider to thoroughly review all the results before contacting the office for clarification of your results.   Thank you for entrusting me with your care  and choosing [facility].  Deanna May, NP

## 2023-05-29 DIAGNOSIS — D7581 Myelofibrosis: Secondary | ICD-10-CM | POA: Diagnosis not present

## 2023-05-29 DIAGNOSIS — F418 Other specified anxiety disorders: Secondary | ICD-10-CM | POA: Diagnosis not present

## 2023-05-29 DIAGNOSIS — N1831 Chronic kidney disease, stage 3a: Secondary | ICD-10-CM | POA: Diagnosis not present

## 2023-05-29 DIAGNOSIS — G4700 Insomnia, unspecified: Secondary | ICD-10-CM | POA: Diagnosis not present

## 2023-05-29 DIAGNOSIS — R634 Abnormal weight loss: Secondary | ICD-10-CM | POA: Diagnosis not present

## 2023-05-29 DIAGNOSIS — R110 Nausea: Secondary | ICD-10-CM | POA: Diagnosis not present

## 2023-05-29 DIAGNOSIS — R61 Generalized hyperhidrosis: Secondary | ICD-10-CM | POA: Diagnosis not present

## 2023-06-12 ENCOUNTER — Encounter: Payer: Self-pay | Admitting: Gastroenterology

## 2023-06-12 ENCOUNTER — Ambulatory Visit: Admitting: Gastroenterology

## 2023-06-12 VITALS — BP 116/63 | HR 91 | Temp 98.6°F | Resp 15 | Ht 62.0 in | Wt 116.4 lb

## 2023-06-12 DIAGNOSIS — Z1211 Encounter for screening for malignant neoplasm of colon: Secondary | ICD-10-CM | POA: Diagnosis not present

## 2023-06-12 DIAGNOSIS — F419 Anxiety disorder, unspecified: Secondary | ICD-10-CM | POA: Diagnosis not present

## 2023-06-12 DIAGNOSIS — K644 Residual hemorrhoidal skin tags: Secondary | ICD-10-CM | POA: Diagnosis not present

## 2023-06-12 DIAGNOSIS — Z860101 Personal history of adenomatous and serrated colon polyps: Secondary | ICD-10-CM | POA: Diagnosis not present

## 2023-06-12 DIAGNOSIS — D123 Benign neoplasm of transverse colon: Secondary | ICD-10-CM | POA: Diagnosis not present

## 2023-06-12 DIAGNOSIS — R634 Abnormal weight loss: Secondary | ICD-10-CM

## 2023-06-12 DIAGNOSIS — N183 Chronic kidney disease, stage 3 unspecified: Secondary | ICD-10-CM | POA: Diagnosis not present

## 2023-06-12 DIAGNOSIS — Z800 Family history of malignant neoplasm of digestive organs: Secondary | ICD-10-CM | POA: Diagnosis not present

## 2023-06-12 DIAGNOSIS — Z860100 Personal history of colon polyps, unspecified: Secondary | ICD-10-CM

## 2023-06-12 DIAGNOSIS — D122 Benign neoplasm of ascending colon: Secondary | ICD-10-CM

## 2023-06-12 DIAGNOSIS — K648 Other hemorrhoids: Secondary | ICD-10-CM | POA: Diagnosis not present

## 2023-06-12 DIAGNOSIS — F32A Depression, unspecified: Secondary | ICD-10-CM | POA: Diagnosis not present

## 2023-06-12 DIAGNOSIS — G4733 Obstructive sleep apnea (adult) (pediatric): Secondary | ICD-10-CM | POA: Diagnosis not present

## 2023-06-12 MED ORDER — SODIUM CHLORIDE 0.9 % IV SOLN: 500.0000 mL | Freq: Once | INTRAVENOUS | Status: DC

## 2023-06-12 NOTE — Progress Notes (Unsigned)
 Sedate, gd SR, tolerated procedure well, VSS, report to RN

## 2023-06-12 NOTE — Progress Notes (Unsigned)
 [facility] History and Physical   Primary Care Physician:  Ailene Ravel, MD   Reason for Procedure:  History of adenomatous colon polyps, family h/o colon cancer  Plan:    Surveillance colonoscopy with possible interventions as needed     HPI: [facility] is a very pleasant 78 y.o. female here for surveillance colonoscopy. Denies any nausea, vomiting, abdominal pain, melena or bright red blood per rectum  The risks and benefits as well as alternatives of endoscopic procedure(s) have been discussed and reviewed. All questions answered. The patient agrees to proceed.    Past Medical History:  Diagnosis Date   Allergy    SEASONAL   Anxiety    Arthritis    back,hands   Asthma    took shots   Bilateral sensorineural hearing loss 07/14/2019   Breast lump 08/29/2020   Chest pain, unspecified 09/27/2020   Chronic kidney disease    Stage 3/ moderate   Depression    Environmental allergies    Essential hypertension 06/12/2015   Fatigue 06/29/2015   Glaucoma    Hypercholesterolemia    Myelofibrosis (HCC)    Rectocele    Sleep apnea, obstructive 06/29/2015   Has CPAP    Past Surgical History:  Procedure Laterality Date   ABDOMINAL HYSTERECTOMY  1992   total   COLONOSCOPY     TUBAL LIGATION      Prior to Admission medications   Medication Sig Start Date End Date Taking? Authorizing Provider  acyclovir (ZOVIRAX) 400 MG tablet Take 400 mg by mouth every other day. Monday , Wednesday and Friday   Yes [provider]  amLODipine (NORVASC) 5 MG tablet Take 5 mg by mouth daily. 04/11/23  Yes [provider]  cholecalciferol (VITAMIN D3) 25 MCG (1000 UNIT) tablet Take 1,000 Units by mouth daily.   Yes [provider]  Cyanocobalamin (VITAMIN B-12 PO) Take by mouth.   Yes [provider]  estradiol (ESTRACE) 0.5 MG tablet Take 0.5 mg by mouth daily.   Yes [provider]  levocetirizine (XYZAL) 5 MG tablet Take 5  mg by mouth every evening.   Yes [provider]  ondansetron (ZOFRAN-ODT) 4 MG disintegrating tablet Take 4 mg by mouth 3 (three) times daily as needed. 04/09/23  Yes [provider]  polyethylene glycol (MIRALAX / GLYCOLAX) 17 g packet Take 17 g by mouth daily. Once to twice a day as needed   Yes [provider]  rosuvastatin (CRESTOR) 10 MG tablet TAKE 1 TABLET DAILY 11/26/22  Yes Tobb, Kardie, DO  zaleplon (SONATA) 10 MG capsule Take 10 mg by mouth at bedtime as needed. 05/29/23  Yes [provider]  nitroGLYCERIN (NITROSTAT) 0.4 MG SL tablet Place 0.4 mg under the tongue every 5 (five) minutes as needed for chest pain. Patient not taking: Reported on 10/11/2022    [provider]  Olopatadine HCl (PATADAY) 0.2 % SOLN as needed.    [provider]    Current Outpatient Medications  Medication Sig Dispense Refill   acyclovir (ZOVIRAX) 400 MG tablet Take 400 mg by mouth every other day. Monday , Wednesday and Friday     amLODipine (NORVASC) 5 MG tablet Take 5 mg by mouth daily.     cholecalciferol (VITAMIN D3) 25 MCG (1000 UNIT) tablet Take 1,000 Units by mouth daily.     Cyanocobalamin (VITAMIN B-12 PO) Take by mouth.     estradiol (ESTRACE) 0.5 MG tablet Take 0.5 mg by mouth daily.  levocetirizine (XYZAL) 5 MG tablet Take 5 mg by mouth every evening.     ondansetron (ZOFRAN-ODT) 4 MG disintegrating tablet Take 4 mg by mouth 3 (three) times daily as needed.     polyethylene glycol (MIRALAX / GLYCOLAX) 17 g packet Take 17 g by mouth daily. Once to twice a day as needed     rosuvastatin (CRESTOR) 10 MG tablet TAKE 1 TABLET DAILY 90 tablet 3   zaleplon (SONATA) 10 MG capsule Take 10 mg by mouth at bedtime as needed.     nitroGLYCERIN (NITROSTAT) 0.4 MG SL tablet Place 0.4 mg under the tongue every 5 (five) minutes as needed for chest pain. (Patient not taking: Reported on 10/11/2022)     Olopatadine HCl (PATADAY) 0.2 % SOLN as needed.      Current Facility-Administered Medications  Medication Dose Route Frequency Provider Last Rate Last Admin   0.9 %  sodium chloride infusion  500 mL Intravenous Once Napoleon Form, MD        Allergies as of 06/12/2023 - Review Complete 06/12/2023  Allergen Reaction Noted   Aspirin Anaphylaxis 04/08/2008   Nsaids Anaphylaxis 04/08/2008   Other Hives 08/08/2018   Penicillins  04/08/2008   Sulfonamide derivatives  04/08/2008    Family History  Problem Relation Age of Onset   Cancer Mother        appendix   Stroke Mother    Dementia Mother    Heart disease Father    Heart attack Father    Coronary artery disease Father    Congestive Heart Failure Sister    Diabetes Sister    Congenital heart disease Brother    Stroke Maternal Grandmother    Lymphoma Daughter    Esophageal cancer Neg Hx    Rectal cancer Neg Hx    Stomach cancer Neg Hx    Colon cancer Neg Hx     Social History   Socioeconomic History   Marital status: Married    Spouse name: Not on file   Number of children: Not on file   Years of education: Not on file   Highest education level: Not on file  Occupational History   Not on file  Tobacco Use   Smoking status: Former    Current packs/day: 0.00    Types: Cigarettes    Quit date: 03/05/1978    Years since quitting: 45.3   Smokeless tobacco: Never  Vaping Use   Vaping status: Never Used  Substance and Sexual Activity   Alcohol use: Yes    Alcohol/week: 2.0 standard drinks of alcohol    Types: 2 Shots of liquor per week    Comment: rarely   Drug use: No   Sexual activity: Not on file  Other Topics Concern   Not on file  Social History Narrative   Not on file   Social Drivers of Health   Financial Resource Strain: Not on file  Food Insecurity: No Food Insecurity (11/15/2021)   Hunger Vital Sign    Worried About Running Out of Food in the Last Year: Never true    Ran Out of Food in the Last Year: Never true  Transportation Needs: No  Transportation Needs (11/15/2021)   PRAPARE - Administrator, Civil Service (Medical): No    Lack of Transportation (Non-Medical): No  Physical Activity: Sufficiently Active (05/04/2021)   Received [facility], [facility]   Exercise Vital Sign    Days of Exercise per Week: 5 days  Minutes of Exercise per Session: 30 min  Stress: Not on file  Social Connections: Not on file  Intimate Partner Violence: Not on file    Review of Systems:  All other review of systems negative except as mentioned in the HPI.  Physical Exam: Vital signs in last 24 hours: BP 135/78   Pulse 98   Temp 98.6 F (37 C) (Temporal)   Ht 5\' 2"  (1.575 m)   Wt 116 lb 6.4 oz (52.8 kg)   SpO2 100%   BMI 21.29 kg/m  General:   Alert, NAD Lungs:  Clear .   Heart:  Regular rate and rhythm Abdomen:  Soft, nontender and nondistended. Neuro/Psych:  Alert and cooperative. Normal mood and affect. A and O x 3  Reviewed labs, radiology imaging, old records and pertinent past GI work up  Patient is appropriate for planned procedure(s) and anesthesia in an ambulatory setting   K. Scherry Ran , MD [phone]

## 2023-06-12 NOTE — Progress Notes (Unsigned)
 Called to room to assist during endoscopic procedure.  Patient ID and intended procedure confirmed with present staff. Received instructions for my participation in the procedure from the performing physician.

## 2023-06-12 NOTE — Progress Notes (Unsigned)
 Pt's states no medical or surgical changes since previsit or office visit.

## 2023-06-12 NOTE — Op Note (Signed)
  [facility] Patient Name: Holly Nixon Procedure Date: 06/12/2023 12:15 PM MRN: [id number] Endoscopist: Napoleon Form , MD, [id number] Age: 78 Referring MD:  Date of Birth: 10/28/1945 Gender: Female Account #: [id number] Procedure:                Colonoscopy Indications:              High risk colon cancer surveillance: Personal                            history of colonic polyps, Screening in patient at                            increased risk: Family history of 1st-degree                            relative with colorectal cancer Medicines:                Monitored Anesthesia Care Procedure:                Pre-Anesthesia Assessment:                           - Prior to the procedure, a History and Physical                            was performed, and patient medications and                            allergies were reviewed. The patient's tolerance of                            previous anesthesia was also reviewed. The risks                            and benefits of the procedure and the sedation                            options and risks were discussed with the patient.                            All questions were answered, and informed consent                            was obtained. Prior Anticoagulants: The patient has                            taken no anticoagulant or antiplatelet agents. ASA                            Grade Assessment: II - A patient with mild systemic                            disease. After reviewing the risks and benefits,  the patient was deemed in satisfactory condition to                            undergo the procedure.                           After obtaining informed consent, the colonoscope                            was passed under direct vision. Throughout the                            procedure, the patient's blood pressure, pulse, and                            oxygen saturations were  monitored continuously. The                            Olympus Scope [id number] was introduced through the                            anus and advanced to the the cecum, identified by                            appendiceal orifice and ileocecal valve. The                            colonoscopy was performed without difficulty. The                            patient tolerated the procedure well. The quality                            of the bowel preparation was adequate. The                            ileocecal valve, appendiceal orifice, and rectum                            were photographed. Scope In: 12:19:10 PM Scope Out: 12:36:55 PM Scope Withdrawal Time: 0 hours 11 minutes 44 seconds  Total Procedure Duration: 0 hours 17 minutes 45 seconds  Findings:                 The perianal and digital rectal examinations were                            normal.                           Two sessile polyps were found in the transverse                            colon and ascending colon. The polyps were 3 to 5  mm in size. These polyps were removed with a cold                            snare. Resection and retrieval were complete.                           Non-bleeding external and internal hemorrhoids were                            found during retroflexion. The hemorrhoids were                            medium-sized. Complications:            No immediate complications. Estimated Blood Loss:     Estimated blood loss was minimal. Impression:               - Two 3 to 5 mm polyps in the transverse colon and                            in the ascending colon, removed with a cold snare.                            Resected and retrieved.                           - Non-bleeding external and internal hemorrhoids. Recommendation:           - Patient has a contact number available for                            emergencies. The signs and symptoms of potential                             delayed complications were discussed with the                            patient. Return to normal activities tomorrow.                            Written discharge instructions were provided to the                            patient.                           - Resume previous diet.                           - Continue present medications.                           - Await pathology results.                           - No repeat colonoscopy due to age.                           -  Miralax 1 capful (17 grams) in 8 ounces of water                            PO daily.                           - Use Benefiber one teaspoon PO TID. Napoleon Form, MD 06/12/2023 12:45:10 PM This report has been signed electronically.

## 2023-06-12 NOTE — Patient Instructions (Signed)
 YOU HAD AN ENDOSCOPIC PROCEDURE TODAY AT THE [facility]:   Refer to the procedure report that was given to you for any specific questions about what was found during the examination.  If the procedure report does not answer your questions, please call your gastroenterologist to clarify.  If you requested that your care partner not be given the details of your procedure findings, then the procedure report has been included in a sealed envelope for you to review at your convenience later.  YOU SHOULD EXPECT: Some feelings of bloating in the abdomen. Passage of more gas than usual.  Walking can help get rid of the air that was put into your GI tract during the procedure and reduce the bloating. If you had a lower endoscopy (such as a colonoscopy or flexible sigmoidoscopy) you may notice spotting of blood in your stool or on the toilet paper. If you underwent a bowel prep for your procedure, you may not have a normal bowel movement for a few days.  Please Note:  You might notice some irritation and congestion in your nose or some drainage.  This is from the oxygen used during your procedure.  There is no need for concern and it should clear up in a day or so.  SYMPTOMS TO REPORT IMMEDIATELY:  Following lower endoscopy (colonoscopy or flexible sigmoidoscopy):  Excessive amounts of blood in the stool  Significant tenderness or worsening of abdominal pains  Swelling of the abdomen that is new, acute  Fever of 100F or higher   For urgent or emergent issues, a gastroenterologist can be reached at any hour by calling [phone]. Do not use MyChart messaging for urgent concerns.    DIET:  We do recommend a small meal at first, but then you may proceed to your regular diet.  Drink plenty of fluids but you should avoid alcoholic beverages for 24 hours.  MEDICATIONS: Continue present medications. Use Mirilax 1 capful (17 grams) in 8 ounces of water by mouth daily. Use Benefiber one teaspoon  by mouth 3 times daily.  FOLLOW UP: Await pathology results. No repeat colonoscopy due to age.  Please see handouts given to you by your recovery nurse: Polyps, Hemorrhoids.  Thank you for allowing [place] to provide for your healthcare needs today.  ACTIVITY:  You should plan to take it easy for the rest of today and you should NOT DRIVE or use heavy machinery until tomorrow (because of the sedation medicines used during the test).    FOLLOW UP: Our staff will call the number listed on your records the next business day following your procedure.  We will call around 7:15- 8:00 am to check on you and address any questions or concerns that you may have regarding the information given to you following your procedure. If we do not reach you, we will leave a message.     If any biopsies were taken you will be contacted by phone or by letter within the next 1-3 weeks.  Please call us at [phone] if you have not heard about the biopsies in 3 weeks.    SIGNATURES/CONFIDENTIALITY: You and/or your care partner have signed paperwork which will be entered into your electronic medical record.  These signatures attest to the fact that that the information above on your After Visit Summary has been reviewed and is understood.  Full responsibility of the confidentiality of this discharge information lies with you and/or your care-partner.

## 2023-06-13 ENCOUNTER — Encounter: Payer: Self-pay | Admitting: Gastroenterology

## 2023-06-13 ENCOUNTER — Telehealth: Payer: Self-pay

## 2023-06-13 NOTE — Telephone Encounter (Signed)
 LMOM

## 2023-06-17 LAB — SURGICAL PATHOLOGY

## 2023-06-19 ENCOUNTER — Telehealth: Payer: Self-pay | Admitting: Gastroenterology

## 2023-06-19 ENCOUNTER — Telehealth: Payer: Self-pay | Admitting: Cardiology

## 2023-06-19 DIAGNOSIS — R634 Abnormal weight loss: Secondary | ICD-10-CM | POA: Diagnosis not present

## 2023-06-19 DIAGNOSIS — R61 Generalized hyperhidrosis: Secondary | ICD-10-CM | POA: Diagnosis not present

## 2023-06-19 DIAGNOSIS — Z9181 History of falling: Secondary | ICD-10-CM | POA: Diagnosis not present

## 2023-06-19 DIAGNOSIS — D7581 Myelofibrosis: Secondary | ICD-10-CM | POA: Diagnosis not present

## 2023-06-19 DIAGNOSIS — R110 Nausea: Secondary | ICD-10-CM | POA: Diagnosis not present

## 2023-06-19 DIAGNOSIS — G4700 Insomnia, unspecified: Secondary | ICD-10-CM | POA: Diagnosis not present

## 2023-06-19 DIAGNOSIS — Z139 Encounter for screening, unspecified: Secondary | ICD-10-CM | POA: Diagnosis not present

## 2023-06-19 DIAGNOSIS — R002 Palpitations: Secondary | ICD-10-CM | POA: Diagnosis not present

## 2023-06-19 DIAGNOSIS — R5383 Other fatigue: Secondary | ICD-10-CM | POA: Diagnosis not present

## 2023-06-19 NOTE — Telephone Encounter (Signed)
 Spoke to patient.  Patient states she has discuss with primary. The plan was that patient will have GI consult which she did and recently had colonoscopy ,awaiting follow up appointment.   Patient states Dr Godwin Lat discontinue hydrochlorothiazide because electrolytes were abnormal ( sodium and potassium) . Per patient,the change occurred around Feb or March of this year.   Patient states every since the change she has been nausea , fatigue and heart skipping and elevated heart rate. She states the numbers she gave earlier are high for her.  She states heart rate usually runs 65-75 bpm.  Patient states primary wanted her  to contact cardiology in case she needs to wear monitor or needed an appointment  to discuss further.  Patient is aware the message will be deferred to Dr Emmette Harms for her response

## 2023-06-19 NOTE — Telephone Encounter (Signed)
 Pt is calling back and call was transferred to Narciso Backers, RD

## 2023-06-19 NOTE — Telephone Encounter (Signed)
 Patient seen in office 05/23/23 and had her colonoscopy 06/12/23. She calls today with complaints of continued nausea, fatigue and weight loss. She has taken Zofran every night before supper since March. She has recently had morning time nausea and has increased her Zofran to twice daily. Miralax and Benefiber are taken daily for her constipation. These are working "only a little." She has to "force myself to eat and can only drink so much." She does not vomit. She does belch or burp and tells me she has "always been a burper." She does not take any PPI or H2 blockers.  Follow up appointment is 07/03/23. Please advise.

## 2023-06-19 NOTE — Telephone Encounter (Signed)
 STAT if HR is under 50 or over 120 (normal HR is 60-100 beats per minute)  What is your heart rate? 88  Do you have a log of your heart rate readings (document readings)? 87,82,89,106,78,88  Do you have any other symptoms? Nausea, fatigue, cold during the day, night sweats, lost 20/25 pds since Feb without trying, insomnia, feels like her heart is skipping beats

## 2023-06-19 NOTE — Telephone Encounter (Signed)
 Called left message to call back    Also Rn as question in the message about the weight loss patient mention . Have contact your primary Doctor of your symptoms especially the weight lis?   ( Patient  can talk with any triage nurse)

## 2023-06-19 NOTE — Telephone Encounter (Signed)
 Inbound call from patient stating she had a colonoscopy on 4/9 with Dr. Leonia Raman and patient states Dr. Leonia Raman mentioned she may need a office visit. Patient states she is still having symptoms and her PCP stated she may need a endoscopy. Patient is requesting a call to discuss. Please advise.

## 2023-06-20 NOTE — Telephone Encounter (Signed)
 Left message for patient to call  back schedule  and schedule an appointment with APP. as Dr Emmette Harms recommend

## 2023-06-20 NOTE — Telephone Encounter (Signed)
 Patient advised.

## 2023-06-20 NOTE — Telephone Encounter (Signed)
 She can see an APP soon please  Kardie Tobb,MD

## 2023-06-24 ENCOUNTER — Encounter: Payer: Self-pay | Admitting: Family Medicine

## 2023-06-25 ENCOUNTER — Encounter: Payer: Self-pay | Admitting: Family Medicine

## 2023-06-25 MED ORDER — ONDANSETRON 4 MG PO TBDP: 4.0000 mg | ORAL_TABLET | Freq: Three times a day (TID) | ORAL | 1 refills | Status: DC | PRN

## 2023-06-25 NOTE — Addendum Note (Signed)
 Addended by: Hikaru Delorenzo E on: 06/25/2023 05:37 PM   Modules accepted: Orders

## 2023-06-25 NOTE — Telephone Encounter (Signed)
 Per Deanna May NP's note okay to refill zofran . Patient informed.

## 2023-06-25 NOTE — Telephone Encounter (Signed)
 Patient requesting Zofran  sent into pharmacy. Please advise.   Thank you

## 2023-07-03 ENCOUNTER — Ambulatory Visit (INDEPENDENT_AMBULATORY_CARE_PROVIDER_SITE_OTHER): Admitting: Physician Assistant

## 2023-07-03 ENCOUNTER — Encounter: Payer: Self-pay | Admitting: Physician Assistant

## 2023-07-03 VITALS — BP 108/60 | HR 88 | Ht 60.0 in | Wt 119.2 lb

## 2023-07-03 DIAGNOSIS — R110 Nausea: Secondary | ICD-10-CM

## 2023-07-03 DIAGNOSIS — K5900 Constipation, unspecified: Secondary | ICD-10-CM

## 2023-07-03 DIAGNOSIS — R634 Abnormal weight loss: Secondary | ICD-10-CM | POA: Diagnosis not present

## 2023-07-03 DIAGNOSIS — R6881 Early satiety: Secondary | ICD-10-CM

## 2023-07-03 DIAGNOSIS — R61 Generalized hyperhidrosis: Secondary | ICD-10-CM

## 2023-07-03 MED ORDER — PANTOPRAZOLE SODIUM 20 MG PO TBEC: 20.0000 mg | DELAYED_RELEASE_TABLET | Freq: Two times a day (BID) | ORAL | 5 refills | Status: DC

## 2023-07-03 NOTE — Patient Instructions (Signed)
 We have sent the following medications to your pharmacy for you to pick up at your convenience: Pantoprazole 20 mg twice daily 30-60 minutes before breakfast and dinner.   Continue Zofran  three times daily for now.   Continue Benefiber twice daily.   Continue Miralax twice daily.   You have been scheduled for an endoscopy. Please follow written instructions given to you at your visit today.  If you use inhalers (even only as needed), please bring them with you on the day of your procedure.  If you take any of the following medications, they will need to be adjusted prior to your procedure:   DO NOT TAKE 7 DAYS PRIOR TO TEST- Trulicity (dulaglutide) Ozempic, Wegovy (semaglutide) Mounjaro (tirzepatide) Bydureon Bcise (exanatide extended release)  DO NOT TAKE 1 DAY PRIOR TO YOUR TEST Rybelsus (semaglutide) Adlyxin (lixisenatide) Victoza (liraglutide) Byetta (exanatide) ___________________________________________________________________  _______________________________________________________  If your blood pressure at your visit was 140/90 or greater, please contact your primary care physician to follow up on this.  _______________________________________________________  If you are age 72 or older, your body mass index should be between 23-30. Your Body mass index is 23.28 kg/m. If this is out of the aforementioned range listed, please consider follow up with your Primary Care Provider.  If you are age 6 or younger, your body mass index should be between 19-25. Your Body mass index is 23.28 kg/m. If this is out of the aformentioned range listed, please consider follow up with your Primary Care Provider.   ________________________________________________________  The [facility] providers would like to encourage you to use MYCHART to communicate with providers for non-urgent requests or questions.  Due to long hold times on the telephone, sending your provider a message by  [facility] may be a faster and more efficient way to get a response.  Please allow 48 business hours for a response.  Please remember that this is for non-urgent requests.  _______________________________________________________

## 2023-07-03 NOTE — Progress Notes (Signed)
 Chief Complaint: Nausea, Fatigue and Constipation  HPI:    Mrs. Holly Nixon is a 78 y/o female with a past medical history who was referred to me by Hamrick, Orest Bio, MD for a complaint of nausea, constipation and fatigue.      05/23/2023 patient seen in clinic by [facility] [place] for constipation.  Also noted a 14 pound weight loss since August 2024.  At that time reviewed recent x-ray which showed moderate stool burden.  It was thought that her symptoms were related to obstipation.  CTAP unremarkable.  She was scheduled for colonoscopy.    05/23/2023 CBC normal, BMP normal, magnesium normal.   4 /9/25 colonoscopy for history of adenomatous polyps and family history of colon cancer.  Findings of two 3-5 mm polyps in transverse colon and ascending colon, nonbleeding internal and external hemorrhoids.  No repeat recommended due to age.  Told to start MiraLAX once daily.  Also Benefiber 1 teaspoon p.o. 3 times daily.   4 /16/25 patient called in and continues with nausea, fatigue and weight loss.  She had taken Zofran  every night before supper since March and recently had increased to morning time nausea using her Zofran  twice daily.  MiraLAX and Benefiber were taken for constipation.  Not on a PPI or H2 blocker.    Today, patient presents to clinic and tells me she feels bad.  She is laying on the exam table as this makes her slightly less nauseous.  Tells me currently she is using Zofran  3 times a day unless she wakes up in the morning and maybe does not have nausea which is not very often.  Today is a bad day and she still feels nauseous regardless of using this medication.  She has been able to eat though, though this does seem to make nausea worse.  No vomiting.  Currently not on a PPI or H2 blocker.  Previously on Pepcid.    As far as constipation, continues on Benefiber, had to decrease this to twice daily as she just could not get in the third dose.  Also using MiraLAX 1-2 times a day.  Currently having  pebble-like stools that sometimes come out in a clump.  Denies abdominal pain.    Does describe occasional chills and night sweats.  Apparently has an appointment with hematology oncology on 5/19 for further discussion.  Also notes an elevated ferritin in the past.    Describes weight loss of about 20 pounds since August.    Denies fever or blood in her stool.  Past Medical History:  Diagnosis Date   Allergy    SEASONAL   Anxiety    Arthritis    back,hands   Asthma    took shots   Bilateral sensorineural hearing loss 07/14/2019   Breast lump 08/29/2020   Chest pain, unspecified 09/27/2020   Chronic kidney disease    Stage 3/ moderate   Depression    Environmental allergies    Essential hypertension 06/12/2015   Fatigue 06/29/2015   Glaucoma    Hypercholesterolemia    Myelofibrosis (HCC)    Rectocele    Sleep apnea, obstructive 06/29/2015   Has CPAP    Past Surgical History:  Procedure Laterality Date   ABDOMINAL HYSTERECTOMY  1992   total   COLONOSCOPY     TUBAL LIGATION      Current Outpatient Medications  Medication Sig Dispense Refill   acyclovir (ZOVIRAX) 400 MG tablet Take 400 mg by mouth every other day. Monday ,  Wednesday and Friday     amLODipine (NORVASC) 5 MG tablet Take 5 mg by mouth daily.     cholecalciferol (VITAMIN D3) 25 MCG (1000 UNIT) tablet Take 1,000 Units by mouth daily.     Cyanocobalamin  (VITAMIN B-12 PO) Take by mouth.     estradiol (ESTRACE) 0.5 MG tablet Take 0.5 mg by mouth daily.     levocetirizine (XYZAL) 5 MG tablet Take 5 mg by mouth every evening.     nitroGLYCERIN  (NITROSTAT ) 0.4 MG SL tablet Place 0.4 mg under the tongue every 5 (five) minutes as needed for chest pain. (Patient not taking: Reported on 10/11/2022)     Olopatadine HCl (PATADAY) 0.2 % SOLN as needed.     ondansetron  (ZOFRAN -ODT) 4 MG disintegrating tablet Take 1 tablet (4 mg total) by mouth 3 (three) times daily as needed. 20 tablet 1   polyethylene glycol (MIRALAX /  GLYCOLAX) 17 g packet Take 17 g by mouth daily. Once to twice a day as needed     rosuvastatin  (CRESTOR ) 10 MG tablet TAKE 1 TABLET DAILY 90 tablet 3   zaleplon (SONATA) 10 MG capsule Take 10 mg by mouth at bedtime as needed.     Current Facility-Administered Medications  Medication Dose Route Frequency Provider Last Rate Last Admin   0.9 %  sodium chloride  infusion  500 mL Intravenous Once Nandigam, Kavitha V, MD        Allergies as of 07/03/2023 - Review Complete 06/13/2023  Allergen Reaction Noted   Aspirin Anaphylaxis 04/08/2008   Nsaids Anaphylaxis 04/08/2008   Other Hives 08/08/2018   Penicillins  04/08/2008   Sulfonamide derivatives  04/08/2008    Family History  Problem Relation Age of Onset   Cancer Mother        appendix   Stroke Mother    Dementia Mother    Heart disease Father    Heart attack Father    Coronary artery disease Father    Congestive Heart Failure Sister    Diabetes Sister    Congenital heart disease Brother    Stroke Maternal Grandmother    Lymphoma Daughter    Esophageal cancer Neg Hx    Rectal cancer Neg Hx    Stomach cancer Neg Hx    Colon cancer Neg Hx     Social History   Socioeconomic History   Marital status: Married    Spouse name: Not on file   Number of children: Not on file   Years of education: Not on file   Highest education level: Not on file  Occupational History   Not on file  Tobacco Use   Smoking status: Former    Current packs/day: 0.00    Types: Cigarettes    Quit date: 03/05/1978    Years since quitting: 45.3   Smokeless tobacco: Never  Vaping Use   Vaping status: Never Used  Substance and Sexual Activity   Alcohol use: Yes    Alcohol/week: 2.0 standard drinks of alcohol    Types: 2 Shots of liquor per week    Comment: rarely   Drug use: No   Sexual activity: Not on file  Other Topics Concern   Not on file  Social History Narrative   Not on file   Social Drivers of Health   Financial Resource Strain:  Not on file  Food Insecurity: No Food Insecurity (11/15/2021)   Hunger Vital Sign    Worried About Running Out of Food in the Last Year: Never true  Ran Out of Food in the Last Year: Never true  Transportation Needs: No Transportation Needs (11/15/2021)   PRAPARE - Administrator, Civil Service (Medical): No    Lack of Transportation (Non-Medical): No  Physical Activity: Sufficiently Active (05/04/2021)   Received from [facility], [facility]   Exercise Vital Sign    Days of Exercise per Week: 5 days    Minutes of Exercise per Session: 30 min  Stress: Not on file  Social Connections: Not on file  Intimate Partner Violence: Not on file    Review of Systems:    Constitutional: No weight loss, fever or chills Cardiovascular: No chest pain  Respiratory: No SOB Gastrointestinal: See HPI and otherwise negative   Physical Exam:  Vital signs: BP 108/60   Pulse 88   Ht 5' (1.524 m)   Wt 119 lb 3.2 oz (54.1 kg)   BMI 23.28 kg/m    Constitutional:   Pleasant Caucasian female appears to be in NAD, Well developed, Well nourished, alert and cooperative + lying on the exam table for the entire visit Respiratory: Respirations even and unlabored. Lungs clear to auscultation bilaterally.   No wheezes, crackles, or rhonchi.  Cardiovascular: Normal S1, S2. No MRG. Regular rate and rhythm. No peripheral edema, cyanosis or pallor.  Gastrointestinal:  Soft, nondistended, nontender. No rebound or guarding. Normal bowel sounds. No appreciable masses or hepatomegaly. Psychiatric: Oriented to person, place and time. Demonstrates good judgement and reason without abnormal affect or behaviors.  RELEVANT LABS AND IMAGING: CBC    Component Value Date/Time   WBC 3.8 (L) 05/23/2023 0933   RBC 4.54 05/23/2023 0933   HGB 14.2 05/23/2023 0933   HGB 14.3 07/18/2005 0909   HCT 41.9 05/23/2023 0933   HCT 41.4 07/18/2005 0909   PLT 197.0 05/23/2023 0933   PLT 250 07/18/2005 0909   MCV  92.4 05/23/2023 0933   MCV 90.6 07/18/2005 0909   MCH 30.0 06/09/2015 1808   MCHC 33.7 05/23/2023 0933   RDW 14.4 05/23/2023 0933   RDW 13.5 07/18/2005 0909   LYMPHSABS 0.7 05/23/2023 0933   LYMPHSABS 1.2 07/18/2005 0909   MONOABS 0.4 05/23/2023 0933   MONOABS 0.4 07/18/2005 0909   EOSABS 0.1 05/23/2023 0933   EOSABS 0.1 07/18/2005 0909   BASOSABS 0.0 05/23/2023 0933   BASOSABS 0.0 07/18/2005 0909    CMP     Component Value Date/Time   NA 140 05/23/2023 0933   K 4.4 05/23/2023 0933   CL 106 05/23/2023 0933   CO2 28 05/23/2023 0933   GLUCOSE 100 (H) 05/23/2023 0933   BUN 12 05/23/2023 0933   CREATININE 0.92 05/23/2023 0933   CALCIUM  10.1 05/23/2023 0933   PROT 7.3 07/18/2005 0909   ALBUMIN 4.6 07/18/2005 0909   AST 27 07/18/2005 0909   ALT 24 07/18/2005 0909   ALKPHOS 50 07/18/2005 0909   BILITOT 0.7 07/18/2005 0909   GFRNONAA 36 (L) 06/09/2015 1808   GFRAA 41 (L) 06/09/2015 1808    Assessment: 1.  Constipation: Doing some better with Benefiber twice daily and MiraLAX 1-2 times a day, recent colonoscopy with no abnormality to explain this; likely due to slight decrease in diet vs IBS 2.  Nausea: Constant and chronic, minimally helped by Zofran , no abdominal pain or vomiting; consider gastritis +/- H. pylori versus other 3.  Weight loss: About 20 pounds since August 2024, recent colonoscopy with no etiology, consider upper GI etiology versus other  Plan: 1.  Reviewed recent  colonoscopy.  Recommend she have an EGD for further evaluation of weight loss and continued nausea.  This is scheduled with Dr. Nandigam at her next available.  Did provide patient a detailed list of risks for the procedure and she agrees to proceed. Patient is appropriate for endoscopic procedure(s) in the ambulatory ([facility]) setting.  2.  Continue Zofran  4 mg 3 times daily for now, did discuss that this can worsen constipation so she may need to increase MiraLAX 3.  Continue Benefiber twice a day and  MiraLAX twice a day 4.  Started Pantoprazole 20 mg twice daily, #60 with 3 refills 5.  Patient to follow in clinic per recommendations from Dr. Nandigam after time of procedure. 6.  Does have some alarm features in regards to night sweats and occasional chills and weight loss, has new patient visit with oncology on 5/19  Reginal Capra, PA-C [facility] 07/03/2023, 1:15 PM  Cc: Annette Barters, MD

## 2023-07-08 NOTE — Progress Notes (Unsigned)
 Cardiology Clinic Note   Patient Name: Holly Nixon Date of Encounter: 07/10/2023  Primary Care Provider:  Annette Barters, MD Primary Cardiologist:  Kardie Tobb, DO  Patient Profile    Holly Nixon 78 year old female presents to the clinic today for follow-up evaluation of her hypertension and elevated heart rate.  Past Medical History    Past Medical History:  Diagnosis Date   Allergy    SEASONAL   Anxiety    Arthritis    back,hands   Asthma    took shots   Bilateral sensorineural hearing loss 07/14/2019   Breast lump 08/29/2020   Chest pain, unspecified 09/27/2020   Chronic kidney disease    Stage 3/ moderate   Depression    Environmental allergies    Essential hypertension 06/12/2015   Fatigue 06/29/2015   Glaucoma    Hypercholesterolemia    Myelofibrosis (HCC)    Rectocele    Sleep apnea, obstructive 06/29/2015   Has CPAP   Past Surgical History:  Procedure Laterality Date   ABDOMINAL HYSTERECTOMY  1992   total   COLONOSCOPY     TUBAL LIGATION      Allergies  Allergies  Allergen Reactions   Aspirin Anaphylaxis    REACTION: anaphylactic reaction   Nsaids Anaphylaxis    REACTION: anaphylactic reaction   Other Hives    INSECTS   Penicillins Rash    REACTION: rash   Sulfonamide Derivatives Other (See Comments)    REACTION: disoriented    History of Present Illness    [name] has a PMH of hypertension, hyperlipidemia, and OSA.  She was seen in follow-up by Dr. Emmette Harms on 10/11/2022.  During that time her medications were continued.  She presented with blood pressure log which Dr. Emmette Harms reviewed.  Her blood pressure was well-controlled.  She had no other complaints.  She did have questions about being started on Wegovy.  It was felt that she did not meet clinical criteria for this.  Her EKG showed sinus rhythm 60 bpm.  She underwent stress testing 9/22 which showed low risk and an EF of 68%.  Her echocardiogram 11/03/2020 showed an LVEF  of 50-55%, G1 DD and no significant valvular abnormalities.  She contacted nurse triage line on 06/19/2023.  She noted heart rates in the mid 80s and over 100.  She reported nausea, fatigue, night sweats, and felt like her heart was skipping beats.  She also noted that she had lost 20/25 pounds since February.  She was instructed to contact her primary doctor about her weight loss.  Plan was made for GI consult.  She underwent colonoscopy and she was awaiting follow-up appointment.  Dr. Godwin Lat had discontinued her hydrochlorothiazide  due to abnormal electrolytes.  Her medication change had occurred in February or March 2025.  Her PCP recommended follow-up with cardiology for cardiac monitor.  She presents to the clinic today for follow-up evaluation and states over the last month she has noted significant fatigue and weakness.  She underwent colonoscopy and endoscopy.  She has also noticed some palpitations.  She notes that she is only able to drink about 40 ounces per day.  She has been eating 3 meals per day and does note that she is been consuming around 1200 cal.  We reviewed her blood pressure log and heart rate log.  She is concerned that her PCP discontinued HCTZ and started her on amlodipine.  We reviewed this.  Her blood pressure is well-controlled.  I  will continue her current medication regimen.  I have asked her to increase her p.o. hydration.  I will order CBC, BMP, magnesium, 30-day cardiac event monitor, and complete echocardiogram.  Will plan follow-up in 8 weeks..  Today's she denies  shortness of breath, lower extremity edema, fatigue,  melena, hematuria, hemoptysis, diaphoresis, weakness, presyncope, syncope, orthopnea, and PND.    Home Medications    Prior to Admission medications   Medication Sig Start Date End Date Taking? Authorizing Provider  acyclovir (ZOVIRAX) 400 MG tablet Take 400 mg by mouth every other day. Monday , Wednesday and Friday    [provider]   amLODipine (NORVASC) 5 MG tablet Take 5 mg by mouth daily. 04/11/23   [provider]  estradiol (ESTRACE) 0.5 MG tablet Take 0.5 mg by mouth daily.    [provider]  levocetirizine (XYZAL) 5 MG tablet Take 5 mg by mouth every evening.    [provider]  nitroGLYCERIN  (NITROSTAT ) 0.4 MG SL tablet Place 0.4 mg under the tongue every 5 (five) minutes as needed for chest pain.    [provider]  Olopatadine HCl (PATADAY) 0.2 % SOLN as needed.    [provider]  ondansetron  (ZOFRAN -ODT) 4 MG disintegrating tablet Take 1 tablet (4 mg total) by mouth 3 (three) times daily as needed. 06/25/23   May, Deanna J, NP  pantoprazole  (PROTONIX ) 20 MG tablet Take 1 tablet (20 mg total) by mouth 2 (two) times daily. 07/03/23   Graciella Lavender, PA  polyethylene glycol (MIRALAX / GLYCOLAX) 17 g packet Take 17 g by mouth daily. Once to twice a day as needed    [provider]  rosuvastatin  (CRESTOR ) 10 MG tablet TAKE 1 TABLET DAILY 11/26/22   Tobb, Kardie, DO  Wheat Dextrin (BENEFIBER ON THE GO) POWD Take by mouth 2 (two) times daily.    [provider]  zaleplon (SONATA) 10 MG capsule Take 10 mg by mouth at bedtime as needed. 05/29/23   [provider]    Family History    Family History  Problem Relation Age of Onset   Cancer Mother        appendix   Stroke Mother    Dementia Mother    Heart disease Father    Heart attack Father    Coronary artery disease Father    Congestive Heart Failure Sister    Diabetes Sister    Congenital heart disease Brother    Stroke Maternal Grandmother    Lymphoma Daughter    Esophageal cancer Neg Hx    Rectal cancer Neg Hx    Stomach cancer Neg Hx    Colon cancer Neg Hx    She indicated that the status of her mother is unknown. She indicated that the status of her father is unknown. She indicated that the status of her sister is unknown. She indicated that the status of her brother is  unknown. She indicated that the status of her maternal grandmother is unknown. She indicated that her daughter is alive. She indicated that the status of her neg hx is unknown.  Social History    Social History   Socioeconomic History   Marital status: Married    Spouse name: Not on file   Number of children: Not on file   Years of education: Not on file   Highest education level: Not on file  Occupational History   Not on file  Tobacco Use   Smoking status: Former  Current packs/day: 0.00    Types: Cigarettes    Quit date: 03/05/1978    Years since quitting: 45.3   Smokeless tobacco: Never  Vaping Use   Vaping status: Never Used  Substance and Sexual Activity   Alcohol use: Yes    Alcohol/week: 2.0 standard drinks of alcohol    Types: 2 Shots of liquor per week    Comment: rarely   Drug use: No   Sexual activity: Yes    Birth control/protection: Post-menopausal  Other Topics Concern   Not on file  Social History Narrative   Not on file   Social Drivers of Health   Financial Resource Strain: Not on file  Food Insecurity: No Food Insecurity (11/15/2021)   Hunger Vital Sign    Worried About Running Out of Food in the Last Year: Never true    Ran Out of Food in the Last Year: Never true  Transportation Needs: No Transportation Needs (11/15/2021)   PRAPARE - Administrator, Civil Service (Medical): No    Lack of Transportation (Non-Medical): No  Physical Activity: Sufficiently Active (05/04/2021)   Received [facility]   Exercise Vital Sign    Days of Exercise per Week: 5 days    Minutes of Exercise per Session: 30 min  Stress: Not on file  Social Connections: Not on file  Intimate Partner Violence: Not on file     Review of Systems    General:  No chills, fever, night sweats or weight changes.  Cardiovascular:  No chest pain, dyspnea on exertion, edema, orthopnea, palpitations, paroxysmal nocturnal dyspnea. Dermatological:  No rash, lesions/masses Respiratory: No cough, dyspnea Urologic: No hematuria, dysuria Abdominal:   No nausea, vomiting, diarrhea, bright red blood per rectum, melena, or hematemesis Neurologic:  No visual changes, wkns, changes in mental status. All other systems reviewed and are otherwise negative except as noted above.  Physical Exam    VS:  BP 102/60 (BP Location: Left Arm, Patient Position: Sitting, Cuff Size: Normal)   Pulse 95   Ht 5\' 2"  (1.575 m)   Wt 118 lb (53.5 kg)   SpO2 99%   BMI 21.58 kg/m  , BMI Body mass index is 21.58 kg/m. GEN: Well nourished, well developed, in no acute distress. HEENT: normal. Neck: Supple, no JVD, carotid bruits, or masses. Cardiac: RRR, no murmurs, rubs, or gallops. No clubbing, cyanosis, edema.  Radials/DP/PT 2+ and equal bilaterally.  Respiratory:  Respirations regular and unlabored, clear to auscultation bilaterally. GI: Soft, nontender, nondistended, BS + x 4. MS: no deformity or atrophy. Skin: warm and dry, no rash. Neuro:  Strength and sensation are intact. Psych: Normal affect.  Accessory Clinical Findings    Recent Labs: 05/23/2023: BUN 12; Creatinine, Ser 0.92; Hemoglobin 14.2; Magnesium 2.3; Platelets 197.0; Potassium 4.4; Sodium 140   Recent Lipid Panel No results found for: "CHOL", "TRIG", "HDL", "CHOLHDL", "VLDL", "LDLCALC", "LDLDIRECT"       ECG personally reviewed by me today-none today.     Echocardiogram 11/03/2020  TTE 9/1/2022IMPRESSIONS   1. Left ventricular ejection fraction, by estimation, is 50 to 55%. The  left ventricle has low normal function. The left ventricle has no regional  wall motion abnormalities. Left ventricular diastolic parameters are  consistent with Grade I diastolic  dysfunction (impaired relaxation). The average left ventricular global  longitudinal strain is -9.7 %. The global longitudinal strain is abnormal.   2. Right ventricular systolic function is normal. The right ventricular  size  is normal. There is normal pulmonary artery systolic pressure.   3. The mitral valve is normal in structure. No evidence of mitral valve  regurgitation. No evidence of mitral stenosis.   4. The aortic valve is normal in structure. Aortic valve regurgitation is  not visualized. No aortic stenosis is present.   5. The inferior vena cava is normal in size with greater than 50%  respiratory variability, suggesting right atrial pressure of 3 mmHg.   Comparison(s): No prior Echocardiogram.   FINDINGS   Left Ventricle: Left ventricular ejection fraction, by estimation, is 50  to 55%. The left ventricle has low normal function. The left ventricle has  no regional wall motion abnormalities. The average left ventricular global  longitudinal strain is -9.7 %.   The global longitudinal strain is abnormal. The left ventricular internal  cavity size was normal in size. There is no left ventricular hypertrophy.  Left ventricular diastolic parameters are consistent with Grade I  diastolic dysfunction (impaired relaxation).   Right Ventricle: The right ventricular size is normal. No increase in  right ventricular wall thickness. Right ventricular systolic function is  normal. There is normal pulmonary artery systolic pressure. The tricuspid  regurgitant velocity is 2.34 m/s, and   with an assumed right atrial pressure of 3 mmHg, the estimated right  ventricular systolic pressure is 24.9 mmHg.   Left Atrium: Left atrial size was normal in size.   Right Atrium: Right atrial size was normal in size.   Pericardium: There is no evidence of pericardial effusion.   Mitral Valve: The mitral valve is normal in structure. No evidence of  mitral valve regurgitation. No evidence of mitral valve stenosis.   Tricuspid Valve: The tricuspid valve is normal in structure. Tricuspid  valve regurgitation is not demonstrated. No evidence of tricuspid  stenosis.   Aortic Valve: The aortic valve is normal in  structure. Aortic valve  regurgitation is not visualized. No aortic stenosis is present.   Pulmonic Valve: The pulmonic valve was normal in structure. Pulmonic valve  regurgitation is not visualized. No evidence of pulmonic stenosis.   Aorta: The aortic root is normal in size and structure.   Venous: The inferior vena cava is normal in size with greater than 50%  respiratory variability, suggesting right atrial pressure of 3 mmHg.   IAS/Shunts: No atrial level shunt detected by color flow Doppler.   Nuclear stress test 11/08/2020   The study is normal. The study is low risk.   Left ventricular function is normal. Nuclear stress EF: 68 %. The left ventricular ejection fraction is hyperdynamic (>65%).   Prior study not available for comparison.     Assessment & Plan   1.  Atypical chest pain,Palpitations-heart rate today 95 bpm.  Previously contacted nurse triage line and reported skipped heartbeats.  She reported significant weight loss since February 2025.  She followed up with PCP who recommended evaluation by cardiology and cardiac monitor.  TSH normal on 06/24/2023.  She does note some atypical chest pain that is intermittent and brief. Order CBC, BMP, magnesium 30-day cardiac event monitor Avoid triggers caffeine, chocolate, EtOH, increase p.o. hydration etc.  Fatigue/ lower extremity weakness-has noticed over the last month.  Underwent colonoscopy and EGD.  EGD was yesterday.  She was diagnosed with gastritis and biopsies were taken. Order echocardiogram  Hyperlipidemia-LDL 71 on 01/14/23. High-fiber diet Increase physical activity as tolerated Continue rosuvastatin   Essential hypertension-BP today 102/60. Maintain blood pressure log Continue amlodipine Heart healthy low-sodium diet  Disposition: Follow-up with Dr. Emmette Harms or me in 6-8 weeks.   Chet Cota. Tomiko Schoon NP-C     07/10/2023, 9:18 AM [facility] [address] Office  [phone] Fax [phone]    I spent 15 minutes examining this patient, reviewing medications, and using patient centered shared decision making involving their cardiac care.   I spent  20 minutes reviewing past medical history,  medications, and prior cardiac tests.

## 2023-07-09 ENCOUNTER — Encounter: Payer: Self-pay | Admitting: Gastroenterology

## 2023-07-09 ENCOUNTER — Ambulatory Visit: Admitting: Gastroenterology

## 2023-07-09 ENCOUNTER — Other Ambulatory Visit: Payer: Self-pay | Admitting: Gastroenterology

## 2023-07-09 VITALS — BP 114/72 | HR 76 | Temp 98.1°F | Resp 21 | Ht 60.0 in | Wt 119.0 lb

## 2023-07-09 DIAGNOSIS — R110 Nausea: Secondary | ICD-10-CM

## 2023-07-09 DIAGNOSIS — R6881 Early satiety: Secondary | ICD-10-CM | POA: Diagnosis not present

## 2023-07-09 DIAGNOSIS — F32A Depression, unspecified: Secondary | ICD-10-CM | POA: Diagnosis not present

## 2023-07-09 DIAGNOSIS — R634 Abnormal weight loss: Secondary | ICD-10-CM

## 2023-07-09 DIAGNOSIS — R61 Generalized hyperhidrosis: Secondary | ICD-10-CM

## 2023-07-09 DIAGNOSIS — K2970 Gastritis, unspecified, without bleeding: Secondary | ICD-10-CM | POA: Diagnosis not present

## 2023-07-09 DIAGNOSIS — K259 Gastric ulcer, unspecified as acute or chronic, without hemorrhage or perforation: Secondary | ICD-10-CM | POA: Diagnosis not present

## 2023-07-09 DIAGNOSIS — K317 Polyp of stomach and duodenum: Secondary | ICD-10-CM | POA: Diagnosis not present

## 2023-07-09 DIAGNOSIS — F419 Anxiety disorder, unspecified: Secondary | ICD-10-CM | POA: Diagnosis not present

## 2023-07-09 MED ORDER — SODIUM CHLORIDE 0.9 % IV SOLN: 500.0000 mL | INTRAVENOUS | Status: DC

## 2023-07-09 MED ORDER — PANTOPRAZOLE SODIUM 40 MG PO TBEC: 40.0000 mg | DELAYED_RELEASE_TABLET | Freq: Two times a day (BID) | ORAL | 3 refills | Status: DC

## 2023-07-09 NOTE — Progress Notes (Unsigned)
 Pt's states no medical or surgical changes since previsit or office visit.

## 2023-07-09 NOTE — Progress Notes (Unsigned)
 Report to PACU, RN, vss, BBS= Clear.

## 2023-07-09 NOTE — Progress Notes (Signed)
 Called to room to assist during endoscopic procedure.  Patient ID and intended procedure confirmed with present staff. Received instructions for my participation in the procedure from the performing physician.

## 2023-07-09 NOTE — Op Note (Signed)
 [facility] Patient Name: Holly Nixon Procedure Date: 07/09/2023 3:26 PM MRN: [id number] Endoscopist: Sergio Dandy , MD, [id number] Age: 78 Referring MD:  Date of Birth: 11-04-1945 Gender: Female Account #: [id number] Procedure:                Upper GI endoscopy Indications:              Epigastric abdominal pain, Nausea, Weight loss Medicines:                Monitored Anesthesia Care Procedure:                Pre-Anesthesia Assessment:                           - Prior to the procedure, a History and Physical                            was performed, and patient medications and                            allergies were reviewed. The patient's tolerance of                            previous anesthesia was also reviewed. The risks                            and benefits of the procedure and the sedation                            options and risks were discussed with the patient.                            All questions were answered, and informed consent                            was obtained. Prior Anticoagulants: The patient has                            taken no anticoagulant or antiplatelet agents. ASA                            Grade Assessment: II - A patient with mild systemic                            disease. After reviewing the risks and benefits,                            the patient was deemed in satisfactory condition to                            undergo the procedure.                           After obtaining informed consent, the endoscope was  passed under direct vision. Throughout the                            procedure, the patient's blood pressure, pulse, and                            oxygen saturations were monitored continuously. The                            GIF [id number] #[id number] was introduced through the                            mouth, and advanced to the second part of duodenum.                             The upper GI endoscopy was accomplished without                            difficulty. The patient tolerated the procedure                            well. Scope In: Scope Out: Findings:                 The Z-line was regular and was found 36 cm from the                            incisors.                           Patchy mild inflammation with hemorrhage                            characterized by congestion (edema), erosions,                            erythema and friability was found in the entire                            examined stomach. Biopsies were taken with a cold                            forceps for Helicobacter pylori testing.                           The cardia and gastric fundus were normal on                            retroflexion.                           The examined duodenum was normal. Complications:            No immediate complications. Estimated Blood Loss:     Estimated blood loss was minimal. Impression:               -  Z-line regular, 36 cm from the incisors.                           - Gastritis with hemorrhage. Biopsied.                           - Normal examined duodenum. Recommendation:           - Patient has a contact number available for                            emergencies. The signs and symptoms of potential                            delayed complications were discussed with the                            patient. Return to normal activities tomorrow.                            Written discharge instructions were provided to the                            patient.                           - Resume previous diet.                           - Continue present medications.                           - Await pathology results.                           - Follow an antireflux regimen.                           - Use Protonix  (pantoprazole ) 40 mg PO BID. Rx for                            90 days with 3 refills [facility] V. Trevion Hoben, MD 07/09/2023  3:55:12 PM This report has been signed electronically.

## 2023-07-09 NOTE — Patient Instructions (Signed)
 Please read handouts provided. Continue present medications. Await pathology results. Follow an antireflux regimen. Begin Protonix  ( pantoprazole  ) 40 mg twice daily for 90 days.  YOU HAD AN ENDOSCOPIC PROCEDURE TODAY AT THE [facility]:   Refer to the procedure report that was given to you for any specific questions about what was found during the examination.  If the procedure report does not answer your questions, please call your gastroenterologist to clarify.  If you requested that your care partner not be given the details of your procedure findings, then the procedure report has been included in a sealed envelope for you to review at your convenience later.  YOU SHOULD EXPECT: Some feelings of bloating in the abdomen. Passage of more gas than usual.  Walking can help get rid of the air that was put into your GI tract during the procedure and reduce the bloating. If you had a lower endoscopy (such as a colonoscopy or flexible sigmoidoscopy) you may notice spotting of blood in your stool or on the toilet paper. If you underwent a bowel prep for your procedure, you may not have a normal bowel movement for a few days.  Please Note:  You might notice some irritation and congestion in your nose or some drainage.  This is from the oxygen used during your procedure.  There is no need for concern and it should clear up in a day or so.  SYMPTOMS TO REPORT IMMEDIATELY:  Following upper endoscopy (EGD)  Vomiting of blood or coffee ground material  New chest pain or pain under the shoulder blades  Painful or persistently difficult swallowing  New shortness of breath  Fever of 100F or higher  Black, tarry-looking stools  For urgent or emergent issues, a gastroenterologist can be reached at any hour by calling [phone]. Do not use MyChart messaging for urgent concerns.    DIET:  We do recommend a small meal at first, but then you may proceed to your regular diet.  Drink plenty  of fluids but you should avoid alcoholic beverages for 24 hours.  ACTIVITY:  You should plan to take it easy for the rest of today and you should NOT DRIVE or use heavy machinery until tomorrow (because of the sedation medicines used during the test).    FOLLOW UP: Our staff will call the number listed on your records the next business day following your procedure.  We will call around 7:15- 8:00 am to check on you and address any questions or concerns that you may have regarding the information given to you following your procedure. If we do not reach you, we will leave a message.     If any biopsies were taken you will be contacted by phone or by letter within the next 1-3 weeks.  Please call us  at [phone] if you have not heard about the biopsies in 3 weeks.    SIGNATURES/CONFIDENTIALITY: You and/or your care partner have signed paperwork which will be entered into your electronic medical record.  These signatures attest to the fact that that the information above on your After Visit Summary has been reviewed and is understood.  Full responsibility of the confidentiality of this discharge information lies with you and/or your care-partner.

## 2023-07-09 NOTE — Progress Notes (Unsigned)
 [facility] History and Physical   Primary Care Physician:  Annette Barters, MD   Reason for Procedure:  Weight loss, nausea, early satiety  Plan:    EGD and colonoscopy with possible interventions as needed     HPI: Holly Nixon is a very pleasant 78 y.o. female here for EGD for weight loss, nausea, early satiety.   The risks and benefits as well as alternatives of endoscopic procedure(s) have been discussed and reviewed. All questions answered. The patient agrees to proceed.    Past Medical History:  Diagnosis Date   Allergy    SEASONAL   Anxiety    Arthritis    back,hands   Asthma    took shots   Bilateral sensorineural hearing loss 07/14/2019   Breast lump 08/29/2020   Chest pain, unspecified 09/27/2020   Chronic kidney disease    Stage 3/ moderate   Depression    Environmental allergies    Essential hypertension 06/12/2015   Fatigue 06/29/2015   Glaucoma    Hypercholesterolemia    Myelofibrosis (HCC)    Rectocele    Sleep apnea, obstructive 06/29/2015   Has CPAP    Past Surgical History:  Procedure Laterality Date   ABDOMINAL HYSTERECTOMY  1992   total   COLONOSCOPY     TUBAL LIGATION      Prior to Admission medications   Medication Sig Start Date End Date Taking? Authorizing Provider  acyclovir (ZOVIRAX) 400 MG tablet Take 400 mg by mouth every other day. Monday , Wednesday and Friday   Yes [provider]  amLODipine (NORVASC) 5 MG tablet Take 5 mg by mouth daily. 04/11/23  Yes [provider]  estradiol (ESTRACE) 0.5 MG tablet Take 0.5 mg by mouth daily.   Yes [provider]  levocetirizine (XYZAL) 5 MG tablet Take 5 mg by mouth every evening.   Yes [provider]  Olopatadine HCl (PATADAY) 0.2 % SOLN as needed.   Yes [provider]  ondansetron  (ZOFRAN -ODT) 4 MG disintegrating tablet Take 1 tablet (4 mg total) by mouth 3 (three) times daily as needed. 06/25/23  Yes May, Deanna J, NP   pantoprazole  (PROTONIX ) 20 MG tablet Take 1 tablet (20 mg total) by mouth 2 (two) times daily. 07/03/23  Yes Lemmon, Kathy Parker, PA  polyethylene glycol (MIRALAX / GLYCOLAX) 17 g packet Take 17 g by mouth daily. Once to twice a day as needed   Yes [provider]  rosuvastatin  (CRESTOR ) 10 MG tablet TAKE 1 TABLET DAILY 11/26/22  Yes Tobb, Kardie, DO  Wheat Dextrin (BENEFIBER ON THE GO) POWD Take by mouth 2 (two) times daily.   Yes [provider]  zaleplon (SONATA) 10 MG capsule Take 10 mg by mouth at bedtime as needed. 05/29/23  Yes [provider]  nitroGLYCERIN  (NITROSTAT ) 0.4 MG SL tablet Place 0.4 mg under the tongue every 5 (five) minutes as needed for chest pain.    [provider]    Current Outpatient Medications  Medication Sig Dispense Refill   acyclovir (ZOVIRAX) 400 MG tablet Take 400 mg by mouth every other day. Monday , Wednesday and Friday     amLODipine (NORVASC) 5 MG tablet Take 5 mg by mouth daily.     estradiol (ESTRACE) 0.5 MG tablet Take 0.5 mg by mouth daily.     levocetirizine (XYZAL) 5 MG tablet Take 5 mg by mouth every evening.     Olopatadine HCl (PATADAY) 0.2 % SOLN as needed.  ondansetron  (ZOFRAN -ODT) 4 MG disintegrating tablet Take 1 tablet (4 mg total) by mouth 3 (three) times daily as needed. 20 tablet 1   pantoprazole  (PROTONIX ) 20 MG tablet Take 1 tablet (20 mg total) by mouth 2 (two) times daily. 60 tablet 5   polyethylene glycol (MIRALAX / GLYCOLAX) 17 g packet Take 17 g by mouth daily. Once to twice a day as needed     rosuvastatin  (CRESTOR ) 10 MG tablet TAKE 1 TABLET DAILY 90 tablet 3   Wheat Dextrin (BENEFIBER ON THE GO) POWD Take by mouth 2 (two) times daily.     zaleplon (SONATA) 10 MG capsule Take 10 mg by mouth at bedtime as needed.     nitroGLYCERIN  (NITROSTAT ) 0.4 MG SL tablet Place 0.4 mg under the tongue every 5 (five) minutes as needed for chest pain.     Current Facility-Administered Medications   Medication Dose Route Frequency Provider Last Rate Last Admin   0.9 %  sodium chloride  infusion  500 mL Intravenous Continuous Azalynn Maxim V, MD        Allergies as of 07/09/2023 - Review Complete 07/09/2023  Allergen Reaction Noted   Aspirin Anaphylaxis 04/08/2008   Nsaids Anaphylaxis 04/08/2008   Other Hives 08/08/2018   Penicillins Rash 04/08/2008   Sulfonamide derivatives Other (See Comments) 04/08/2008    Family History  Problem Relation Age of Onset   Cancer Mother        appendix   Stroke Mother    Dementia Mother    Heart disease Father    Heart attack Father    Coronary artery disease Father    Congestive Heart Failure Sister    Diabetes Sister    Congenital heart disease Brother    Stroke Maternal Grandmother    Lymphoma Daughter    Esophageal cancer Neg Hx    Rectal cancer Neg Hx    Stomach cancer Neg Hx    Colon cancer Neg Hx     Social History   Socioeconomic History   Marital status: Married    Spouse name: Not on file   Number of children: Not on file   Years of education: Not on file   Highest education level: Not on file  Occupational History   Not on file  Tobacco Use   Smoking status: Former    Current packs/day: 0.00    Types: Cigarettes    Quit date: 03/05/1978    Years since quitting: 45.3   Smokeless tobacco: Never  Vaping Use   Vaping status: Never Used  Substance and Sexual Activity   Alcohol use: Yes    Alcohol/week: 2.0 standard drinks of alcohol    Types: 2 Shots of liquor per week    Comment: rarely   Drug use: No   Sexual activity: Yes    Birth control/protection: Post-menopausal  Other Topics Concern   Not on file  Social History Narrative   Not on file   Social Drivers of Health   Financial Resource Strain: Not on file  Food Insecurity: No Food Insecurity (11/15/2021)   Hunger Vital Sign    Worried About Running Out of Food in the Last Year: Never true    Ran Out of Food in the Last Year: Never true   Transportation Needs: No Transportation Needs (11/15/2021)   PRAPARE - Administrator, Civil Service (Medical): No    Lack of Transportation (Non-Medical): No  Physical Activity: Sufficiently Active (05/04/2021)   Received from [facility], [facility]  Exercise Vital Sign    Days of Exercise per Week: 5 days    Minutes of Exercise per Session: 30 min  Stress: Not on file  Social Connections: Not on file  Intimate Partner Violence: Not on file    Review of Systems:  All other review of systems negative except as mentioned in the HPI.  Physical Exam: Vital signs in last 24 hours: BP (!) 155/83   Pulse 90   Temp 98.1 F (36.7 C) (Temporal)   Resp 19   Ht 5' (1.524 m)   Wt 119 lb (54 kg)   SpO2 100%   BMI 23.24 kg/m  General:   Alert, NAD Lungs:  Clear .   Heart:  Regular rate and rhythm Abdomen:  Soft, nontender and nondistended. Neuro/Psych:  Alert and cooperative. Normal mood and affect. A and O x 3  Reviewed labs, radiology imaging, old records and pertinent past GI work up  Patient is appropriate for planned procedure(s) and anesthesia in an ambulatory setting   K. Veena Khole Branch , MD [phone]

## 2023-07-10 ENCOUNTER — Telehealth: Payer: Self-pay

## 2023-07-10 ENCOUNTER — Encounter: Payer: Self-pay | Admitting: General Practice

## 2023-07-10 ENCOUNTER — Ambulatory Visit: Attending: General Practice | Admitting: General Practice

## 2023-07-10 VITALS — BP 102/60 | HR 95 | Ht 62.0 in | Wt 118.0 lb

## 2023-07-10 DIAGNOSIS — I10 Essential (primary) hypertension: Secondary | ICD-10-CM | POA: Diagnosis not present

## 2023-07-10 DIAGNOSIS — E782 Mixed hyperlipidemia: Secondary | ICD-10-CM | POA: Insufficient documentation

## 2023-07-10 DIAGNOSIS — R5383 Other fatigue: Secondary | ICD-10-CM | POA: Insufficient documentation

## 2023-07-10 DIAGNOSIS — R531 Weakness: Secondary | ICD-10-CM | POA: Diagnosis not present

## 2023-07-10 DIAGNOSIS — R002 Palpitations: Secondary | ICD-10-CM | POA: Diagnosis not present

## 2023-07-10 DIAGNOSIS — R0789 Other chest pain: Secondary | ICD-10-CM | POA: Insufficient documentation

## 2023-07-10 NOTE — Telephone Encounter (Signed)
  Follow up Call-     07/09/2023    2:48 PM 06/12/2023   11:19 AM  Call back number  Post procedure Call Back phone  # [phone]  Permission to leave phone message Yes Yes     Patient questions:  Do you have a fever, pain , or abdominal swelling? No. Pain Score  0 *  Have you tolerated food without any problems? Yes.    Have you been able to return to your normal activities? Yes.    Do you have any questions about your discharge instructions: Diet   No. Medications  No. Follow up visit  No.  Do you have questions or concerns about your Care? No.  Actions: * If pain score is 4 or above: No action needed, pain <4.

## 2023-07-10 NOTE — Patient Instructions (Addendum)
 Medication Instructions:  Your physician recommends that you continue on your current medications as directed. Please refer to the Current Medication list given to you today.  *If you need a refill on your cardiac medications before your next appointment, please call your pharmacy*  Lab Work: To be completed today: BMP, CBC, and magnesium   If you have labs (blood work) drawn today and your tests are completely normal, you will receive your results only by: MyChart Message (if you have MyChart) OR A paper copy in the mail If you have any lab test that is abnormal or we need to change your treatment, we will call you to review the results.  Testing/Procedures: Your physician has recommended that you wear an event monitor. Event monitors are medical devices that record the heart's electrical activity. Doctors most often us  these monitors to diagnose arrhythmias. Arrhythmias are problems with the speed or rhythm of the heartbeat. The monitor is a small, portable device. You can wear one while you do your normal daily activities. This is usually used to diagnose what is causing palpitations/syncope (passing out).  Your physician has requested that you have an echocardiogram. Echocardiography is a painless test that uses sound waves to create images of your heart. It provides your doctor with information about the size and shape of your heart and how well your heart's chambers and valves are working. This procedure takes approximately one hour. There are no restrictions for this procedure. Please do NOT wear cologne, perfume, aftershave, or lotions (deodorant is allowed). Please arrive 15 minutes prior to your appointment time.  Please note: We ask at that you not bring children with you during ultrasound (echo/ vascular) testing. Due to room size and safety concerns, children are not allowed in the ultrasound rooms during exams. Our front office staff cannot provide observation of children in our  lobby area while testing is being conducted. An adult accompanying a patient to their appointment will only be allowed in the ultrasound room at the discretion of the ultrasound technician under special circumstances. We apologize for any inconvenience.   Follow-Up: At [facility], you and your health needs are our priority.  As part of our continuing mission to provide you with exceptional heart care, our providers are all part of one team.  This team includes your primary Cardiologist (physician) and Advanced Practice Providers or APPs (Physician Assistants and Nurse Practitioners) who all work together to provide you with the care you need, when you need it.  Your next appointment:   8 week(s)  The format for your next appointment:   In Person  Provider:   Lawana Pray, NP  We recommend signing up for the patient portal called "MyChart".  Sign up information is provided on this After Visit Summary.  MyChart is used to connect with patients for Virtual Visits (Telemedicine).  Patients are able to view lab/test results, encounter notes, upcoming appointments, etc.  Non-urgent messages can be sent to your provider as well.   To learn more about what you can do with MyChart, go to [email or web address].   Other Instructions Increase hydration daily.

## 2023-07-11 ENCOUNTER — Encounter: Payer: Self-pay | Admitting: Gastroenterology

## 2023-07-11 LAB — CBC
Hematocrit: 43 % (ref 34.0–46.6)
Hemoglobin: 14.1 g/dL (ref 11.1–15.9)
MCH: 30.7 pg (ref 26.6–33.0)
MCHC: 32.8 g/dL (ref 31.5–35.7)
MCV: 94 fL (ref 79–97)
Platelets: 233 10*3/uL (ref 150–450)
RBC: 4.59 x10E6/uL (ref 3.77–5.28)
RDW: 13.4 % (ref 11.7–15.4)
WBC: 5.5 10*3/uL (ref 3.4–10.8)

## 2023-07-11 LAB — BASIC METABOLIC PANEL WITH GFR
BUN/Creatinine Ratio: 13 (ref 12–28)
BUN: 14 mg/dL (ref 8–27)
CO2: 20 mmol/L (ref 20–29)
Calcium: 10 mg/dL (ref 8.7–10.3)
Chloride: 100 mmol/L (ref 96–106)
Creatinine, Ser: 1.04 mg/dL — ABNORMAL HIGH (ref 0.57–1.00)
Glucose: 108 mg/dL — ABNORMAL HIGH (ref 70–99)
Potassium: 4.6 mmol/L (ref 3.5–5.2)
Sodium: 136 mmol/L (ref 134–144)
eGFR: 55 mL/min/1.73 — ABNORMAL LOW

## 2023-07-11 LAB — MAGNESIUM: Magnesium: 2.4 mg/dL — ABNORMAL HIGH (ref 1.6–2.3)

## 2023-07-12 LAB — SURGICAL PATHOLOGY

## 2023-07-16 ENCOUNTER — Ambulatory Visit: Payer: Self-pay

## 2023-07-18 ENCOUNTER — Ambulatory Visit: Payer: Self-pay | Admitting: Gastroenterology

## 2023-07-19 DIAGNOSIS — M940 Chondrocostal junction syndrome [Tietze]: Secondary | ICD-10-CM | POA: Diagnosis not present

## 2023-07-19 DIAGNOSIS — J3089 Other allergic rhinitis: Secondary | ICD-10-CM | POA: Diagnosis not present

## 2023-07-19 DIAGNOSIS — J301 Allergic rhinitis due to pollen: Secondary | ICD-10-CM | POA: Diagnosis not present

## 2023-07-19 DIAGNOSIS — J3081 Allergic rhinitis due to animal (cat) (dog) hair and dander: Secondary | ICD-10-CM | POA: Diagnosis not present

## 2023-07-22 ENCOUNTER — Inpatient Hospital Stay: Attending: Hematology and Oncology | Admitting: Hematology and Oncology

## 2023-07-22 ENCOUNTER — Encounter: Payer: Self-pay | Admitting: Hematology and Oncology

## 2023-07-22 ENCOUNTER — Inpatient Hospital Stay

## 2023-07-22 VITALS — BP 149/78 | HR 94 | Resp 18 | Ht 62.0 in | Wt 117.8 lb

## 2023-07-22 DIAGNOSIS — R7989 Other specified abnormal findings of blood chemistry: Secondary | ICD-10-CM | POA: Diagnosis not present

## 2023-07-22 DIAGNOSIS — Z79624 Long term (current) use of inhibitors of nucleotide synthesis: Secondary | ICD-10-CM | POA: Diagnosis not present

## 2023-07-22 DIAGNOSIS — Z818 Family history of other mental and behavioral disorders: Secondary | ICD-10-CM | POA: Diagnosis not present

## 2023-07-22 DIAGNOSIS — Z882 Allergy status to sulfonamides status: Secondary | ICD-10-CM | POA: Insufficient documentation

## 2023-07-22 DIAGNOSIS — Z886 Allergy status to analgesic agent status: Secondary | ICD-10-CM | POA: Insufficient documentation

## 2023-07-22 DIAGNOSIS — Z90710 Acquired absence of both cervix and uterus: Secondary | ICD-10-CM | POA: Insufficient documentation

## 2023-07-22 DIAGNOSIS — D7581 Myelofibrosis: Secondary | ICD-10-CM | POA: Insufficient documentation

## 2023-07-22 DIAGNOSIS — Z823 Family history of stroke: Secondary | ICD-10-CM | POA: Insufficient documentation

## 2023-07-22 DIAGNOSIS — Z807 Family history of other malignant neoplasms of lymphoid, hematopoietic and related tissues: Secondary | ICD-10-CM | POA: Insufficient documentation

## 2023-07-22 DIAGNOSIS — Z87891 Personal history of nicotine dependence: Secondary | ICD-10-CM | POA: Insufficient documentation

## 2023-07-22 DIAGNOSIS — I10 Essential (primary) hypertension: Secondary | ICD-10-CM | POA: Insufficient documentation

## 2023-07-22 DIAGNOSIS — Z79899 Other long term (current) drug therapy: Secondary | ICD-10-CM | POA: Diagnosis not present

## 2023-07-22 DIAGNOSIS — R634 Abnormal weight loss: Secondary | ICD-10-CM | POA: Insufficient documentation

## 2023-07-22 DIAGNOSIS — Z880 Allergy status to penicillin: Secondary | ICD-10-CM | POA: Insufficient documentation

## 2023-07-22 DIAGNOSIS — Z8249 Family history of ischemic heart disease and other diseases of the circulatory system: Secondary | ICD-10-CM | POA: Diagnosis not present

## 2023-07-22 DIAGNOSIS — K922 Gastrointestinal hemorrhage, unspecified: Secondary | ICD-10-CM | POA: Diagnosis not present

## 2023-07-22 DIAGNOSIS — Z833 Family history of diabetes mellitus: Secondary | ICD-10-CM | POA: Insufficient documentation

## 2023-07-22 NOTE — Progress Notes (Signed)
  Cancer Center CONSULT NOTE  Patient Care Team: Hamrick, Orest Bio, MD as PCP - General (Family Medicine) Jerryl Morin, DO as PCP - Cardiology (Cardiology) Auther Bo, RN as Nurse Navigator  ASSESSMENT & PLAN:  Weight loss, abnormal The patient has erroneous diagnosis of myelofibrosis Based on her normal CBC over the past 18 years, she does not have myelofibrosis She has never had bone marrow biopsy done  She is worried about cancer due to her abnormal weight loss In going through her diet history, she has very poor dietary choices because she has been restricting herself with low magnesium diet and low protein diet, worrying herself that her choices of diet would cause abnormal magnesium and high ferritin She is also concerned that by eating more, she might end up with bowel surgery which I believe is due to poor understanding I recommend the patient to increase oral intake as tolerated, and to add additional nutritional supplement and higher protein intake I do not have any unifying blood work or imaging study to clear her from a cancer diagnosis  Elevated ferritin I look at her iron saturation studies from April 2025 which came back normal Based on first glance, I do not believe she has hemochromatosis I do not believe she would benefit from ordering DNA test to confirm based on borderline elevated ferritin level  No orders of the defined types were placed in this encounter.   The total time spent in the appointment was 55 minutes encounter with patients including review of chart and various tests results, discussions about plan of care and coordination of care plan   All questions were answered. The patient knows to call the clinic with any problems, questions or concerns. No barriers to learning was detected.  Almeda Jacobs, MD 5/19/202512:35 PM  CHIEF COMPLAINTS/PURPOSE OF CONSULTATION:  Diagnosis of myelofibrosis, weight loss  HISTORY OF PRESENTING ILLNESS:   Holly Nixon 78 y.o. female is here because of diagnosis of myelofibrosis and weight loss The patient was told over 20 years ago that she has myelofibrosis She has never had a bone marrow biopsy done and was never followed in an oncology center On further questioning, according to the patient, she had some abnormal hip imaging and was told she had myelofibrosis Her CBC from the electronic records were reviewed I have reviewed her CBC dated back to 2007 and all her CBC has been normal except for borderline low white blood cell count on May 23, 2023 at 3.8 but subsequent repeat CBC on Jul 10, 2023 came back normal She has lost a lot of weight over the past year, for at least 20 pounds According to the patient, she has been restricting her diet initially because she thinks if she eats more, she could end up with some sort of bowel surgery.  She had extensive workup by gastroenterologist with recent upper and lower endoscopy.  Multiple polyps were found but biopsy was negative.  Some mild GI bleeding was noted on upper endoscopy and she is placed on high dose proton pump inhibitor She has been restricting herself with low magnesium diet because she was found to have borderline elevated high magnesium.  She has also restricting red meat because she think it could cause elevated ferritin.  Her ferritin was drawn in April of this year along with iron, TIBC, UIBC and iron saturation which all came back normal.  She was told by her primary care doctor that elevated ferritin is likely caused by inflammation  and not from hemochromatosis but in her mind, she is still concerned about hemochromatosis Overall, she looks anxious today In going through her diet history, it appears that her oral intake per meal is minimum and typically low in protein She is concerned about cancer and is wondering whether she can have some blood tests that could rule out cancer completely  MEDICAL HISTORY:  Past Medical History:   Diagnosis Date   Allergy    SEASONAL   Anxiety    Arthritis    back,hands   Asthma    took shots   Bilateral sensorineural hearing loss 07/14/2019   Breast lump 08/29/2020   Chest pain, unspecified 09/27/2020   Chronic kidney disease    Stage 3/ moderate   Depression    Environmental allergies    Essential hypertension 06/12/2015   Fatigue 06/29/2015   Glaucoma    Hypercholesterolemia    Rectocele    Sleep apnea, obstructive 06/29/2015   Has CPAP    SURGICAL HISTORY: Past Surgical History:  Procedure Laterality Date   ABDOMINAL HYSTERECTOMY  1992   total   COLONOSCOPY     TUBAL LIGATION      SOCIAL HISTORY: Social History   Socioeconomic History   Marital status: Married    Spouse name: Not on file   Number of children: 1   Years of education: Not on file   Highest education level: Not on file  Occupational History   Not on file  Tobacco Use   Smoking status: Former    Current packs/day: 0.00    Types: Cigarettes    Quit date: 03/05/1978    Years since quitting: 45.4   Smokeless tobacco: Never  Vaping Use   Vaping status: Never Used  Substance and Sexual Activity   Alcohol use: Yes    Alcohol/week: 2.0 standard drinks of alcohol    Types: 2 Shots of liquor per week    Comment: rarely   Drug use: No   Sexual activity: Yes    Birth control/protection: Post-menopausal  Other Topics Concern   Not on file  Social History Narrative   Not on file   Social Drivers of Health   Financial Resource Strain: Not on file  Food Insecurity: No Food Insecurity (11/15/2021)   Hunger Vital Sign    Worried About Running Out of Food in the Last Year: Never true    Ran Out of Food in the Last Year: Never true  Transportation Needs: No Transportation Needs (11/15/2021)   PRAPARE - Administrator, Civil Service (Medical): No    Lack of Transportation (Non-Medical): No  Physical Activity: Sufficiently Active (05/04/2021)   Received from [facility],  [facility]   Exercise Vital Sign    Days of Exercise per Week: 5 days    Minutes of Exercise per Session: 30 min  Stress: Not on file  Social Connections: Not on file  Intimate Partner Violence: Not on file    FAMILY HISTORY: Family History  Problem Relation Age of Onset   Cancer Mother        appendix   Stroke Mother    Dementia Mother    Heart disease Father    Heart attack Father    Coronary artery disease Father    Congestive Heart Failure Sister    Diabetes Sister    Congenital heart disease Brother    Stroke Maternal Grandmother    Lymphoma Daughter    Esophageal cancer Neg Hx  Rectal cancer Neg Hx    Stomach cancer Neg Hx    Colon cancer Neg Hx     ALLERGIES:  is allergic to aspirin, nsaids, other, penicillins, and sulfonamide derivatives.  MEDICATIONS:  Current Outpatient Medications  Medication Sig Dispense Refill   acyclovir (ZOVIRAX) 400 MG tablet Take 400 mg by mouth every other day. Monday , Wednesday and Friday     amLODipine (NORVASC) 5 MG tablet Take 5 mg by mouth daily.     estradiol (ESTRACE) 0.5 MG tablet Take 0.5 mg by mouth daily.     levocetirizine (XYZAL) 5 MG tablet Take 5 mg by mouth every evening.     nitroGLYCERIN  (NITROSTAT ) 0.4 MG SL tablet Place 0.4 mg under the tongue every 5 (five) minutes as needed for chest pain.     Olopatadine HCl (PATADAY) 0.2 % SOLN as needed.     ondansetron  (ZOFRAN -ODT) 4 MG disintegrating tablet Take 1 tablet (4 mg total) by mouth 3 (three) times daily as needed. 20 tablet 1   pantoprazole  (PROTONIX ) 40 MG tablet Take 1 tablet (40 mg total) by mouth 2 (two) times daily. Take for 90 days. 60 tablet 3   polyethylene glycol (MIRALAX / GLYCOLAX) 17 g packet Take 17 g by mouth daily. Once to twice a day as needed     rosuvastatin  (CRESTOR ) 10 MG tablet TAKE 1 TABLET DAILY 90 tablet 3   Wheat Dextrin (BENEFIBER ON THE GO) POWD Take by mouth 2 (two) times daily.     zaleplon (SONATA) 10 MG capsule Take 10 mg by  mouth at bedtime as needed.     No current facility-administered medications for this visit.    REVIEW OF SYSTEMS:  All other systems were reviewed with the patient and are negative.  PHYSICAL EXAMINATION: ECOG PERFORMANCE STATUS: 1 - Symptomatic but completely ambulatory  Vitals:   07/22/23 1202  BP: (!) 149/78  Pulse: 94  Resp: 18  SpO2: 99%   Filed Weights   07/22/23 1202  Weight: 117 lb 12.8 oz (53.4 kg)    GENERAL:alert, no distress and comfortable.  She looks thin and cachectic  LABORATORY DATA:  I have reviewed the data as listed Lab Results  Component Value Date   WBC 5.5 07/10/2023   HGB 14.1 07/10/2023   HCT 43.0 07/10/2023   MCV 94 07/10/2023   PLT 233 07/10/2023   Recent Labs    05/23/23 0933 07/10/23 1008  NA 140 136  K 4.4 4.6  CL 106 100  CO2 28 20  GLUCOSE 100* 108*  BUN 12 14  CREATININE 0.92 1.04*  CALCIUM  10.1 10.0

## 2023-07-22 NOTE — Assessment & Plan Note (Signed)
 I look at her iron saturation studies from April 2025 which came back normal Based on first glance, I do not believe she has hemochromatosis I do not believe she would benefit from ordering DNA test to confirm based on borderline elevated ferritin level

## 2023-07-22 NOTE — Assessment & Plan Note (Signed)
 The patient has erroneous diagnosis of myelofibrosis Based on her normal CBC over the past 18 years, she does not have myelofibrosis She has never had bone marrow biopsy done  She is worried about cancer due to her abnormal weight loss In going through her diet history, she has very poor dietary choices because she has been restricting herself with low magnesium diet and low protein diet, worrying herself that her choices of diet would cause abnormal magnesium and high ferritin She is also concerned that by eating more, she might end up with bowel surgery which I believe is due to poor understanding I recommend the patient to increase oral intake as tolerated, and to add additional nutritional supplement and higher protein intake I do not have any unifying blood work or imaging study to clear her from a cancer diagnosis

## 2023-07-23 DIAGNOSIS — R002 Palpitations: Secondary | ICD-10-CM | POA: Diagnosis not present

## 2023-08-01 DIAGNOSIS — N1831 Chronic kidney disease, stage 3a: Secondary | ICD-10-CM | POA: Diagnosis not present

## 2023-08-01 DIAGNOSIS — F419 Anxiety disorder, unspecified: Secondary | ICD-10-CM | POA: Diagnosis not present

## 2023-08-01 DIAGNOSIS — R634 Abnormal weight loss: Secondary | ICD-10-CM | POA: Diagnosis not present

## 2023-08-12 ENCOUNTER — Ambulatory Visit: Payer: Self-pay | Admitting: Gastroenterology

## 2023-08-15 ENCOUNTER — Ambulatory Visit (HOSPITAL_COMMUNITY)
Admission: RE | Admit: 2023-08-15 | Discharge: 2023-08-15 | Disposition: A | Discharge status: Home / Self Care | Source: Ambulatory Visit | Attending: Cardiology | Admitting: Cardiology

## 2023-08-15 DIAGNOSIS — R531 Weakness: Secondary | ICD-10-CM | POA: Insufficient documentation

## 2023-08-15 DIAGNOSIS — R0789 Other chest pain: Secondary | ICD-10-CM | POA: Diagnosis not present

## 2023-08-15 DIAGNOSIS — R5383 Other fatigue: Secondary | ICD-10-CM | POA: Insufficient documentation

## 2023-08-15 DIAGNOSIS — R002 Palpitations: Secondary | ICD-10-CM | POA: Insufficient documentation

## 2023-08-15 LAB — ECHOCARDIOGRAM COMPLETE
Area-P 1/2: 5.34 cm2
S' Lateral: 2.96 cm

## 2023-08-20 ENCOUNTER — Telehealth: Payer: Self-pay | Admitting: Gastroenterology

## 2023-08-20 NOTE — Telephone Encounter (Signed)
 Inbound call from patient stating she has been constipated. States she has been trying Miralax but has not been working. Also states she has been having nausea, fatigue, and losing weight. Patient is requesting a call back to discuss further. Please advise, thank you.

## 2023-08-20 NOTE — Telephone Encounter (Signed)
 Called and spoke with patient. She states that she has been struggling with constipation. However on Saturday, she had a total of 7 bms and the last one was consistency of diarrhea. Pt states that she held the Miralax on Sunday morning, however by 6 pm that night she had not had a bm so she took a dose. She reports that she did not have a bm on Monday and took a total of 2 doses and she took a dose this morning and still has not had a bm. Patient states that she last took her Benefiber on Saturday. She is afraid to take this medication as this will make her more constipated. She reports that he has limited her food intake, as her magnesium levels were elevated 2.4. The provider told her she needed to avoid magnesium rich foods. She reports she has lost about 25 pounds in 3 months. She is unsure what else to take for the constipation.

## 2023-08-21 NOTE — Telephone Encounter (Signed)
 Discontinue Benefiber if she feels she is not tolerating it.  Use MiraLAX 1 capful twice daily and titrate up or down to have soft 2-3 bowel movements per day.  Please schedule office visit next available appointment with me or APP

## 2023-08-22 DIAGNOSIS — R300 Dysuria: Secondary | ICD-10-CM | POA: Diagnosis not present

## 2023-08-22 NOTE — Telephone Encounter (Signed)
 Spoke with pt. Discussed Dr. Allean Aran recommendations.Pt is very concerned about taking 2 capfuls of Miralax every day. States she worries this will make her go to much. Discussed titration of Miralax with patient, she verbalized understanding. Did not want to schedule an office visit at this time. Advised patient that if she wanted to further discuss medication use, to call back and schedule an appointment at her convenience.

## 2023-08-26 ENCOUNTER — Ambulatory Visit: Attending: General Practice

## 2023-08-26 DIAGNOSIS — R002 Palpitations: Secondary | ICD-10-CM

## 2023-08-26 DIAGNOSIS — R413 Other amnesia: Secondary | ICD-10-CM | POA: Diagnosis not present

## 2023-08-26 DIAGNOSIS — R634 Abnormal weight loss: Secondary | ICD-10-CM | POA: Diagnosis not present

## 2023-08-26 DIAGNOSIS — M791 Myalgia, unspecified site: Secondary | ICD-10-CM | POA: Diagnosis not present

## 2023-08-26 DIAGNOSIS — K5904 Chronic idiopathic constipation: Secondary | ICD-10-CM | POA: Diagnosis not present

## 2023-08-26 DIAGNOSIS — F419 Anxiety disorder, unspecified: Secondary | ICD-10-CM | POA: Diagnosis not present

## 2023-08-26 DIAGNOSIS — E871 Hypo-osmolality and hyponatremia: Secondary | ICD-10-CM | POA: Diagnosis not present

## 2023-09-02 DIAGNOSIS — E782 Mixed hyperlipidemia: Secondary | ICD-10-CM | POA: Diagnosis not present

## 2023-09-02 DIAGNOSIS — K5900 Constipation, unspecified: Secondary | ICD-10-CM | POA: Diagnosis not present

## 2023-09-02 DIAGNOSIS — R634 Abnormal weight loss: Secondary | ICD-10-CM | POA: Diagnosis not present

## 2023-09-02 DIAGNOSIS — E871 Hypo-osmolality and hyponatremia: Secondary | ICD-10-CM | POA: Diagnosis not present

## 2023-09-02 DIAGNOSIS — R5383 Other fatigue: Secondary | ICD-10-CM | POA: Diagnosis not present

## 2023-09-02 NOTE — Progress Notes (Unsigned)
 Cardiology Clinic Note   Patient Name: Holly Nixon Date of Encounter: 09/04/2023  Primary Care Provider:  Stephanie Charlene LITTIE, MD Primary Cardiologist:  Holly Tobb, DO  Patient Profile    Holly Nixon 78 year old female presents to the clinic today for follow-up evaluation of her hypertension and elevated heart rate.  Past Medical History    Past Medical History:  Diagnosis Date   Allergy    SEASONAL   Anxiety    Arthritis    back,hands   Asthma    took shots   Bilateral sensorineural hearing loss 07/14/2019   Breast lump 08/29/2020   Chest pain, unspecified 09/27/2020   Chronic kidney disease    Stage 3/ moderate   Depression    Environmental allergies    Essential hypertension 06/12/2015   Fatigue 06/29/2015   Glaucoma    Hypercholesterolemia    Rectocele    Sleep apnea, obstructive 06/29/2015   Has CPAP   Past Surgical History:  Procedure Laterality Date   ABDOMINAL HYSTERECTOMY  1992   total   COLONOSCOPY     TUBAL LIGATION      Allergies  Allergies  Allergen Reactions   Aspirin Anaphylaxis    REACTION: anaphylactic reaction   Nsaids Anaphylaxis    REACTION: anaphylactic reaction   Other Hives    INSECTS   Penicillins Rash    REACTION: rash   Sulfonamide Derivatives Other (See Comments)    REACTION: disoriented    History of Present Illness    Holly Nixon has a PMH of hypertension, hyperlipidemia, and OSA.  She was seen in follow-up by Holly Nixon on 10/11/2022.  During that time her medications were continued.  She presented with blood pressure log which Holly Nixon.  Her blood pressure was well-controlled.  She had no other complaints.  She did have questions about being started on Wegovy.  It was felt that she did not meet clinical criteria for this.  Her EKG showed sinus rhythm 60 bpm.  She underwent stress testing 9/22 which showed low risk and an EF of 68%.  Her echocardiogram 11/03/2020 showed an LVEF of 50-55%, G1 DD and  no significant valvular abnormalities.  She contacted nurse triage line on 06/19/2023.  She noted heart rates in the mid 80s and over 100.  She reported nausea, fatigue, night sweats, and felt like her heart was skipping beats.  She also noted that she had lost 20/25 pounds since February.  She was instructed to contact her primary doctor about her weight loss.  Plan was made for GI consult.  She underwent colonoscopy and she was awaiting follow-up appointment.  Dr. Stephanie had discontinued her hydrochlorothiazide  due to abnormal electrolytes.  Her medication change had occurred in February or March 2025.  Her PCP recommended follow-up with cardiology for cardiac monitor.  She presented to the clinic 07/10/23 for follow-up evaluation and stated over the last month she had noted significant fatigue and weakness.  She underwent colonoscopy and endoscopy.  She had also noticed some palpitations.  She noted that she was only able to drink about 40 ounces per day.  She had been eating 3 meals per day and did note that she had been consuming around 1200 cal.  We Nixon her blood pressure log and heart rate log.  She was concerned that her PCP discontinued HCTZ and started her on amlodipine.  We Nixon this.  Her blood pressure was well-controlled.  I continued her medication regimen.  I asked her to increase her p.o. hydration.  I  order CBC, BMP, magnesium, 30-day cardiac event monitor, and complete echocardiogram.  Follow-up in 8 weeks was planned.  Her echocardiogram 08/15/2023 showed an LVEF of 55-60%, G1 DD, trivial mitral valve regurgitation with mild prolapse of posterior leaflet.  No other significant valvular abnormalities were noted. Her lab work was reassuring.  Cardiac event monitor has not yet resulted.  She presents to the clinic today for follow-up evaluation and states she continues to feel fatigued and has been losing weight.  She is about 8 pounds down from when I saw her back in May.  We  Nixon her echocardiogram.  Cardiac event monitor has not yet resulted.  We Nixon her recent lab work.  She reports that she had lab work drawn at her primary care provider as well which showed hyponatremia.  She was encouraged to increase the sodium in her diet and fluids.  I reassured her and her husband that her EKG showed no concerning findings.  She reports that she has a CT scheduled in the near future.  This is to evaluate her chest.  She will also be having a head MRI.  She notes that she is very sedentary through the day but is trying to eat breakfast lunch and dinner.  She occasionally takes her dog out for walks.  She is concerned about losing muscle as well.  I encouraged her to increase her physical activity as tolerated and increase the calories in her diet we will plan follow-up in 6 months.  I reassured her that her symptoms do not appear to be cardiac related.    Home Medications    Prior to Admission medications   Medication Sig Start Date End Date Taking? Authorizing Provider  acyclovir (ZOVIRAX) 400 MG tablet Take 400 mg by mouth every other day. Monday , Wednesday and Friday    [provider]  amLODipine (NORVASC) 5 MG tablet Take 5 mg by mouth daily. 04/11/23   [provider]  estradiol (ESTRACE) 0.5 MG tablet Take 0.5 mg by mouth daily.    [provider]  levocetirizine (XYZAL) 5 MG tablet Take 5 mg by mouth every evening.    [provider]  nitroGLYCERIN  (NITROSTAT ) 0.4 MG SL tablet Place 0.4 mg under the tongue every 5 (five) minutes as needed for chest pain.    [provider]  Olopatadine HCl (PATADAY) 0.2 % SOLN as needed.    [provider]  ondansetron  (ZOFRAN -ODT) 4 MG disintegrating tablet Take 1 tablet (4 mg total) by mouth 3 (three) times daily as needed. 06/25/23   May, Deanna J, NP  pantoprazole  (PROTONIX ) 20 MG tablet Take 1 tablet (20 mg total) by mouth 2 (two) times daily. 07/03/23   Beather Delon Gibson, PA  polyethylene glycol (MIRALAX / GLYCOLAX) 17 g packet Take 17 g by mouth daily. Once to twice a day as needed    [provider]  rosuvastatin  (CRESTOR ) 10 MG tablet TAKE 1 TABLET DAILY 11/26/22   Nixon, Kardie, DO  Wheat Dextrin (BENEFIBER ON THE GO) POWD Take by mouth 2 (two) times daily.    [provider]  zaleplon (SONATA) 10 MG capsule Take 10 mg by mouth at bedtime as needed. 05/29/23   [provider]    Family History    Family History  Problem Relation Age of Onset   Cancer Mother        appendix   Stroke Mother  Dementia Mother    Heart disease Father    Heart attack Father    Coronary artery disease Father    Congestive Heart Failure Sister    Diabetes Sister    Congenital heart disease Brother    Stroke Maternal Grandmother    Lymphoma Daughter    Esophageal cancer Neg Hx    Rectal cancer Neg Hx    Stomach cancer Neg Hx    Colon cancer Neg Hx    She indicated that the status of her mother is unknown. She indicated that the status of her father is unknown. She indicated that the status of her sister is unknown. She indicated that the status of her brother is unknown. She indicated that the status of her maternal grandmother is unknown. She indicated that her daughter is alive. She indicated that the status of her neg hx is unknown.  Social History    Social History   Socioeconomic History   Marital status: Married    Spouse name: Not on file   Number of children: 1   Years of education: Not on file   Highest education level: Not on file  Occupational History   Not on file  Tobacco Use   Smoking status: Former    Current packs/day: 0.00    Types: Cigarettes    Quit date: 03/05/1978    Years since quitting: 45.5   Smokeless tobacco: Never  Vaping Use   Vaping status: Never Used  Substance and Sexual Activity   Alcohol use: Yes    Alcohol/week: 2.0 standard drinks of alcohol    Types: 2 Shots of liquor per week     Comment: rarely   Drug use: No   Sexual activity: Yes    Birth control/protection: Post-menopausal  Other Topics Concern   Not on file  Social History Narrative   Not on file   Social Drivers of Health   Financial Resource Strain: Not on file  Food Insecurity: No Food Insecurity (11/15/2021)   Hunger Vital Sign    Worried About Running Out of Food in the Last Year: Never true    Ran Out of Food in the Last Year: Never true  Transportation Needs: No Transportation Needs (11/15/2021)   PRAPARE - Administrator, Civil Service (Medical): No    Lack of Transportation (Non-Medical): No  Physical Activity: Sufficiently Active (05/04/2021)   Received [facility]   Exercise Vital Sign    On average, how many days per week do you engage in moderate to strenuous exercise (like a brisk walk)?: 5 days    On average, how many minutes do you engage in exercise at this level?: 30 min  Stress: Not on file  Social Connections: Not on file  Intimate Partner Violence: Not on file     Review of Systems    General:  No chills, fever, night sweats or weight changes.  Cardiovascular:  No chest pain, dyspnea on exertion, edema, orthopnea, palpitations, paroxysmal nocturnal dyspnea. Dermatological: No rash, lesions/masses Respiratory: No cough, dyspnea Urologic: No hematuria, dysuria Abdominal:   No nausea, vomiting, diarrhea, bright red blood per rectum, melena, or hematemesis Neurologic:  No visual changes, wkns, changes in mental status. All other systems Nixon and are otherwise negative except as noted above.  Physical Exam    VS:  BP 126/68 (BP Location: Left Arm, Patient Position: Sitting)   Pulse 95   Ht 5' 2 (1.575 m)   Wt 110 lb 6.4 oz (50.1  kg)   SpO2 100%   BMI 20.19 kg/m  , BMI Body mass index is 20.19 kg/m. GEN: Well nourished, well developed, in no acute distress. HEENT: normal. Neck: Supple, no JVD, carotid bruits, or masses. Cardiac: RRR, no murmurs,  rubs, or gallops. No clubbing, cyanosis, edema.  Radials/DP/PT 2+ and equal bilaterally.  Respiratory:  Respirations regular and unlabored, clear to auscultation bilaterally. GI: Soft, nontender, nondistended, BS + x 4. MS: no deformity or atrophy. Skin: warm and dry, no rash. Neuro:  Strength and sensation are intact. Psych: Normal affect.  Accessory Clinical Findings    Recent Labs: 07/10/2023: BUN 14; Creatinine, Ser 1.04; Hemoglobin 14.1; Magnesium 2.4; Platelets 233; Potassium 4.6; Sodium 136   Recent Lipid Panel No results found for: CHOL, TRIG, HDL, CHOLHDL, VLDL, LDLCALC, LDLDIRECT       ECG personally Nixon by me today-none today.     Echocardiogram 11/03/2020  TTE 9/1/2022IMPRESSIONS   1. Left ventricular ejection fraction, by estimation, is 50 to 55%. The  left ventricle has low normal function. The left ventricle has no regional  wall motion abnormalities. Left ventricular diastolic parameters are  consistent with Grade I diastolic  dysfunction (impaired relaxation). The average left ventricular global  longitudinal strain is -9.7 %. The global longitudinal strain is abnormal.   2. Right ventricular systolic function is normal. The right ventricular  size is normal. There is normal pulmonary artery systolic pressure.   3. The mitral valve is normal in structure. No evidence of mitral valve  regurgitation. No evidence of mitral stenosis.   4. The aortic valve is normal in structure. Aortic valve regurgitation is  not visualized. No aortic stenosis is present.   5. The inferior vena cava is normal in size with greater than 50%  respiratory variability, suggesting right atrial pressure of 3 mmHg.   Comparison(s): No prior Echocardiogram.   FINDINGS   Left Ventricle: Left ventricular ejection fraction, by estimation, is 50  to 55%. The left ventricle has low normal function. The left ventricle has  no regional wall motion abnormalities. The average  left ventricular global  longitudinal strain is -9.7 %.   The global longitudinal strain is abnormal. The left ventricular internal  cavity size was normal in size. There is no left ventricular hypertrophy.  Left ventricular diastolic parameters are consistent with Grade I  diastolic dysfunction (impaired relaxation).   Right Ventricle: The right ventricular size is normal. No increase in  right ventricular wall thickness. Right ventricular systolic function is  normal. There is normal pulmonary artery systolic pressure. The tricuspid  regurgitant velocity is 2.34 m/s, and   with an assumed right atrial pressure of 3 mmHg, the estimated right  ventricular systolic pressure is 24.9 mmHg.   Left Atrium: Left atrial size was normal in size.   Right Atrium: Right atrial size was normal in size.   Pericardium: There is no evidence of pericardial effusion.   Mitral Valve: The mitral valve is normal in structure. No evidence of  mitral valve regurgitation. No evidence of mitral valve stenosis.   Tricuspid Valve: The tricuspid valve is normal in structure. Tricuspid  valve regurgitation is not demonstrated. No evidence of tricuspid  stenosis.   Aortic Valve: The aortic valve is normal in structure. Aortic valve  regurgitation is not visualized. No aortic stenosis is present.   Pulmonic Valve: The pulmonic valve was normal in structure. Pulmonic valve  regurgitation is not visualized. No evidence of pulmonic stenosis.   Aorta: The aortic  root is normal in size and structure.   Venous: The inferior vena cava is normal in size with greater than 50%  respiratory variability, suggesting right atrial pressure of 3 mmHg.   IAS/Shunts: No atrial level shunt detected by color flow Doppler.   Echocardiogram 08/15/2023 IMPRESSIONS     1. Left ventricular ejection fraction, by estimation, is 55 to 60%. Left  ventricular ejection fraction by 3D volume is 59 %. The left ventricle has  normal  function. The left ventricle has no regional wall motion  abnormalities. Left ventricular diastolic   parameters are consistent with Grade I diastolic dysfunction (impaired  relaxation). The average left ventricular global longitudinal strain is  -20.3 %. The global longitudinal strain is normal.   2. Right ventricular systolic function is normal. The right ventricular  size is normal. There is normal pulmonary artery systolic pressure.   3. The mitral valve is normal in structure. Trivial mitral valve  regurgitation. No evidence of mitral stenosis. There is mild prolapse of  posterior leaflet of the mitral valve.   4. The aortic valve is tricuspid. Aortic valve regurgitation is not  visualized. No aortic stenosis is present.   5. The inferior vena cava is normal in size with greater than 50%  respiratory variability, suggesting right atrial pressure of 3 mmHg.   FINDINGS   Left Ventricle: Left ventricular ejection fraction, by estimation, is 55  to 60%. Left ventricular ejection fraction by 3D volume is 59 %. The left  ventricle has normal function. The left ventricle has no regional wall  motion abnormalities. The average  left ventricular global longitudinal strain is -20.3 %. Strain was  performed and the global longitudinal strain is normal. The left  ventricular internal cavity size was normal in size. There is no left  ventricular hypertrophy. Left ventricular diastolic  parameters are consistent with Grade I diastolic dysfunction (impaired  relaxation). Indeterminate filling pressures.   Right Ventricle: The right ventricular size is normal. No increase in  right ventricular wall thickness. Right ventricular systolic function is  normal. There is normal pulmonary artery systolic pressure. The tricuspid  regurgitant velocity is 1.98 m/s, and   with an assumed right atrial pressure of 3 mmHg, the estimated right  ventricular systolic pressure is 18.7 mmHg.   Left Atrium: Left  atrial size was normal in size.   Right Atrium: Right atrial size was normal in size.   Pericardium: There is no evidence of pericardial effusion.   Mitral Valve: The mitral valve is normal in structure. There is mild  prolapse of posterior leaflet of the mitral valve. Trivial mitral valve  regurgitation. No evidence of mitral valve stenosis.   Tricuspid Valve: The tricuspid valve is normal in structure. Tricuspid  valve regurgitation is mild . No evidence of tricuspid stenosis.   Aortic Valve: The aortic valve is tricuspid. Aortic valve regurgitation is  not visualized. No aortic stenosis is present.   Pulmonic Valve: The pulmonic valve was normal in structure. Pulmonic valve  regurgitation is not visualized. No evidence of pulmonic stenosis.   Aorta: The aortic root is normal in size and structure.   Venous: The inferior vena cava is normal in size with greater than 50%  respiratory variability, suggesting right atrial pressure of 3 mmHg.   IAS/Shunts: No atrial level shunt detected by color flow Doppler.    Nuclear stress test 11/08/2020   The study is normal. The study is low risk.   Left ventricular function is normal. Nuclear  stress EF: 68 %. The left ventricular ejection fraction is hyperdynamic (>65%).   Prior study not available for comparison.     Assessment & Plan   1.  Atypical chest pain,Palpitations-heart rate today 95 bpm.  Previously contacted nurse triage line and reported skipped heartbeats.  She reported significant weight loss since February 2025.  She followed up with PCP who recommended evaluation by cardiology and cardiac monitor.  TSH normal on 06/24/2023.   Lab work reassuring.  Echocardiogram reassuring.  Details above. Await 30-day cardiac event monitor results Avoid triggers caffeine, chocolate, EtOH, increase p.o. hydration etc.  Fatigue/ lower extremity weakness-has noticed over the last several month.  Underwent colonoscopy and EGD.  EGD was  yesterday.  She was diagnosed with gastritis and biopsies were taken.  Echocardiogram reassuring. Continue heart healthy low-sodium diet Increase physical activity as tolerated Continue current medication regimen  Essential hypertension-BP today 126/68 Maintain blood pressure log Continue amlodipine Heart healthy low-sodium diet  Hyperlipidemia-previous LDL 71 on 01/14/23.  High-fiber diet Increase physical activity as tolerated Continue rosuvastatin  Repeat fasting lipids and LFTs 11/25   Disposition: Follow-up with Holly Nixon or me in 6 months.   Josefa HERO. Yousaf Sainato NP-C     09/04/2023, 8:30 AM [facility] [address] Office [phone] Fax [phone]    I spent 14 minutes examining this patient, reviewing medications, and using patient centered shared decision making involving their cardiac care.   I spent  20 minutes reviewing past medical history,  medications, and prior cardiac tests.

## 2023-09-04 ENCOUNTER — Encounter: Payer: Self-pay | Admitting: General Practice

## 2023-09-04 ENCOUNTER — Ambulatory Visit: Attending: General Practice | Admitting: General Practice

## 2023-09-04 VITALS — BP 126/68 | HR 95 | Ht 62.0 in | Wt 110.4 lb

## 2023-09-04 DIAGNOSIS — R5383 Other fatigue: Secondary | ICD-10-CM | POA: Diagnosis not present

## 2023-09-04 DIAGNOSIS — R002 Palpitations: Secondary | ICD-10-CM | POA: Insufficient documentation

## 2023-09-04 DIAGNOSIS — I10 Essential (primary) hypertension: Secondary | ICD-10-CM | POA: Diagnosis not present

## 2023-09-04 DIAGNOSIS — E782 Mixed hyperlipidemia: Secondary | ICD-10-CM | POA: Insufficient documentation

## 2023-09-04 DIAGNOSIS — R0789 Other chest pain: Secondary | ICD-10-CM | POA: Diagnosis not present

## 2023-09-04 NOTE — Patient Instructions (Addendum)
 Medication Instructions:  NO CHANGES   Lab Work: NONE   Testing/Procedures: NONE  Follow-Up: At [facility], you and your health needs are our priority.  As part of our continuing mission to provide you with exceptional heart care, our providers are all part of one team.  This team includes your primary Cardiologist (physician) and Advanced Practice Providers or APPs (Physician Assistants and Nurse Practitioners) who all work together to provide you with the care you need, when you need it.  Your next appointment:   6 MONTHS  Provider:   Kardie Tobb, DO    PLEASE MAKE SURE TO INCREASE YOUR CALORIE INTAKE AND PHYSICAL ACTIVITY.

## 2023-09-05 ENCOUNTER — Ambulatory Visit: Admitting: Behavioral Health

## 2023-09-05 ENCOUNTER — Encounter: Payer: Self-pay | Admitting: Behavioral Health

## 2023-09-05 VITALS — BP 122/78 | HR 101 | Ht 62.0 in | Wt 108.0 lb

## 2023-09-05 DIAGNOSIS — F411 Generalized anxiety disorder: Secondary | ICD-10-CM

## 2023-09-05 NOTE — Telephone Encounter (Signed)
 Called and spoke with patient. Relayed provider message. Patient states her PCP is on vacation, but she will follow up with her when she returns to office.

## 2023-09-05 NOTE — Telephone Encounter (Signed)
 Please advise her to take Colace 1 tablet twice daily.  Follow-up with PCP for hyponatremia

## 2023-09-05 NOTE — Telephone Encounter (Signed)
 Patient should contact her PMD for clarification on which laxatives she can use because MiraLAX and Colace are safer than mineral oil and do not cause electrolyte imbalance

## 2023-09-05 NOTE — Telephone Encounter (Addendum)
 Inbound call from patient, states her sodium levels are depleted so her PCP took her off of Miralax. She states she is now constipated and has not had any bowel movements. Would like to know if she can take a stool softener or what else she can do. She states she is going to a doctors appt and will be away for a couple hours, okay to leave voicemail.

## 2023-09-05 NOTE — Telephone Encounter (Signed)
 Spoke with patient, gave provider recommendations to use Colace. Patient is concerned about using this medication because the generic name is Docusate Sodium and patient is concerned about taking anything that could affect her sodium levels. Patient would like to know if she can try a mineral oil enema since she has not had a bm in several days. This RN recommended trying natural laxatives such as prunes, warm prune juice, and Smooth Move tea. Patient states she has tried the prunes and prune juice, but it has not had any effect so far. Recommended trying Milk of Magnesia, patient states she is not supposed to have anything with magnesium in it.

## 2023-09-05 NOTE — Progress Notes (Addendum)
 [facility] MD/PA/NP Initial Note  09/05/2023 12:25 PM Holly Nixon  MRN:  [id number]  Chief Complaint:  Chief Complaint   Anxiety; Establish Care; Medication Problem; Patient Education     HPI:   Holly Nixon, 78 year old female presents to this office for initial visit and to establish care.  Says that she has been experiencing nausea, fatigue, vision problems, and anxiety.  States that she has been examined by various providers and so far no physiological determination.  Says that she was referred to psychiatry because PCP believes that it might be associated with some anxiety.  She has previously tried 2 different types of antidepressants which may have further reduced sodium levels.  She has since stopped any psychiatric medication.  Says that she has pending MRI and other diagnostics this month.  She acknowledges problems with some mild anxiety.  Also reports hearing songs like sounds in left ear which may be related to tinnitus.  Says that she is not sure if she wants to try any new medication for anxiety at this time and would like to have time to have further testing completed.  Says that she would follow-up in a couple months to reassess.  She rates depression today at 0/10 and anxiety at 3/10.  Her PHQ 2 was negative.  Her GAD was score of 5.  MDQ was grossly negative.  Reports some continued problems with insomnia and poor sleep quality.  She is currently taking Ambien nightly.  Reports continued weight loss with poor appetite.  Denies any mania, no psychosis, no auditory or visual hallucinations.  No SI or HI.  Strong family support system with husband.  Past psychiatric medication trials: Bupropion     Visit Diagnosis:    ICD-10-CM   1. Generalized anxiety disorder  F41.1       Past Psychiatric History: MDD, Anxiety  Past Medical History:  Past Medical History:  Diagnosis Date   Allergy    SEASONAL   Anxiety    Arthritis    back,hands   Asthma    took shots    Bilateral sensorineural hearing loss 07/14/2019   Breast lump 08/29/2020   Chest pain, unspecified 09/27/2020   Chronic kidney disease    Stage 3/ moderate   Depression    Diabetes mellitus, type II (HCC)    Environmental allergies    Essential hypertension 06/12/2015   Fatigue 06/29/2015   Glaucoma    Hypercholesterolemia    Rectocele    Sleep apnea, obstructive 06/29/2015   Has CPAP    Past Surgical History:  Procedure Laterality Date   ABDOMINAL HYSTERECTOMY  1992   total   COLONOSCOPY     TUBAL LIGATION      Family Psychiatric History: see chart  Family History:  Family History  Problem Relation Age of Onset   Cancer Mother        appendix   Stroke Mother    Dementia Mother    Heart disease Father    Heart attack Father    Coronary artery disease Father    Congestive Heart Failure Sister    Diabetes Sister    Congenital heart disease Brother    Stroke Maternal Grandmother    Lymphoma Daughter    Esophageal cancer Neg Hx    Rectal cancer Neg Hx    Stomach cancer Neg Hx    Colon cancer Neg Hx     Social History:  Social History   Socioeconomic History   Marital status: Married  Spouse name: Jayson   Number of children: 1   Years of education: 48   Highest education level: Not on file  Occupational History   Occupation: retired  Tobacco Use   Smoking status: Former    Current packs/day: 0.00    Types: Cigarettes    Quit date: 03/05/1978    Years since quitting: 45.5   Smokeless tobacco: Never  Vaping Use   Vaping status: Never Used  Substance and Sexual Activity   Alcohol use: Yes    Alcohol/week: 2.0 standard drinks of alcohol    Types: 2 Shots of liquor per week    Comment: rarely   Drug use: No   Sexual activity: Not Currently    Birth control/protection: Post-menopausal  Other Topics Concern   Not on file  Social History Narrative   Lives in [place] with husband.    Social Drivers of Corporate investment banker Strain: Not on  file  Food Insecurity: No Food Insecurity (11/15/2021)   Hunger Vital Sign    Worried About Running Out of Food in the Last Year: Never true    Ran Out of Food in the Last Year: Never true  Transportation Needs: No Transportation Needs (11/15/2021)   PRAPARE - Administrator, Civil Service (Medical): No    Lack of Transportation (Non-Medical): No  Physical Activity: Sufficiently Active (05/04/2021)   Received from [facility]   Exercise Vital Sign    On average, how many days per week do you engage in moderate to strenuous exercise (like a brisk walk)?: 5 days    On average, how many minutes do you engage in exercise at this level?: 30 min  Stress: Not on file  Social Connections: Not on file    Allergies:  Allergies  Allergen Reactions   Aspirin Anaphylaxis    REACTION: anaphylactic reaction   Nsaids Anaphylaxis    REACTION: anaphylactic reaction   Other Hives    INSECTS   Penicillins Rash    REACTION: rash   Sulfonamide Derivatives Other (See Comments)    REACTION: disoriented    Metabolic Disorder Labs: Lab Results  Component Value Date   HGBA1C 5.4 06/14/2021   No results found for: PROLACTIN No results found for: CHOL, TRIG, HDL, CHOLHDL, VLDL, LDLCALC Lab Results  Component Value Date   TSH 1.83 06/10/2015    Therapeutic Level Labs: No results found for: LITHIUM No results found for: VALPROATE No results found for: CBMZ  Current Medications: Current Outpatient Medications  Medication Sig Dispense Refill   acyclovir (ZOVIRAX) 400 MG tablet Take 400 mg by mouth every other day. Monday , Wednesday and Friday     amLODipine (NORVASC) 5 MG tablet Take 5 mg by mouth daily.     estradiol (ESTRACE) 0.5 MG tablet Take 0.5 mg by mouth daily.     nitroGLYCERIN  (NITROSTAT ) 0.4 MG SL tablet Place 0.4 mg under the tongue every 5 (five) minutes as needed for chest pain.     Olopatadine HCl (PATADAY) 0.2 % SOLN as needed.      pantoprazole  (PROTONIX ) 40 MG tablet Take 1 tablet (40 mg total) by mouth 2 (two) times daily. Take for 90 days. 60 tablet 3   rosuvastatin  (CRESTOR ) 10 MG tablet TAKE 1 TABLET DAILY 90 tablet 3   No current facility-administered medications for this visit.    Medication Side Effects: none  Orders placed this visit:  No orders of the defined types were placed in this encounter.  Psychiatric Specialty Exam:  Review of Systems  Constitutional:  Positive for fatigue and unexpected weight change.  HENT:  Positive for hearing loss.   Eyes:  Positive for visual disturbance.  Gastrointestinal:  Positive for constipation and nausea.  Allergic/Immunologic: Negative.     Blood pressure 122/78, pulse (!) 101, height 5' 2 (1.575 m), weight 108 lb (49 kg).Body mass index is 19.75 kg/m.  General Appearance: Casual, Neat, and Well Groomed  Eye Contact:  Good  Speech:  Clear and Coherent  Volume:  Normal  Mood:  NA  Affect:  Congruent  Thought Process:  Coherent  Orientation:  Full (Time, Place, and Person)  Thought Content: Logical   Suicidal Thoughts:  No  Homicidal Thoughts:  No  Memory:  WNL  Judgement:  Good  Insight:  Good  Psychomotor Activity:  Normal  Concentration:  Concentration: Good  Recall:  Good  Fund of Knowledge: Good  Language: Good  Assets:  Desire for Improvement  ADL's:  Intact  Cognition: WNL  Prognosis:  Good   Screenings:  GAD-7    Flowsheet Row Office Visit from 09/05/2023 in [facility]  Total GAD-7 Score 5   PHQ2-9    Flowsheet Row Office Visit [facility]/05/2023 [facility]  PHQ-2 Total Score 1    Receiving Psychotherapy: No   Treatment Plan/Recommendations:   Greater than 50% of 60 min face to face time with patient was spent on counseling and coordination of care. We discussed her report of mild anxiety.  We talked about her report of hearing songs predominantly and left ear possibly  descriptive of tinnitus.  She does not feel she is ready to start a new medication for anxiety at this time and would like to follow-up with scheduled MRI and other diagnostics first.  We discussed various medications including SSRIs or other antidepressants that may cause reduced  sodium levels.  Also discussed some mild insomnia in which she is currently taking Ambien and decrease in appetite.  I reviewed with her the benefits of considering mirtazapine which has been shown not to cause decrease in sodium and have various benefits such as sleep, increase in appetite, and may help with mild anxiety and depression.  Patient also expresses difficulty with bowel movements but has not been able to continue taking MiraLAX due to reduced sodium levels.  She is utilizing prune juice and will also try natural raw ginger.  She would like to follow-up in 2 months and we will further discuss.   No medication changes today.  Redell DELENA Pizza, NP

## 2023-09-09 DIAGNOSIS — J439 Emphysema, unspecified: Secondary | ICD-10-CM | POA: Diagnosis not present

## 2023-09-09 DIAGNOSIS — R051 Acute cough: Secondary | ICD-10-CM | POA: Diagnosis not present

## 2023-09-10 DIAGNOSIS — Z01419 Encounter for gynecological examination (general) (routine) without abnormal findings: Secondary | ICD-10-CM | POA: Diagnosis not present

## 2023-09-10 DIAGNOSIS — Z1231 Encounter for screening mammogram for malignant neoplasm of breast: Secondary | ICD-10-CM | POA: Diagnosis not present

## 2023-09-11 DIAGNOSIS — R413 Other amnesia: Secondary | ICD-10-CM | POA: Diagnosis not present

## 2023-09-16 ENCOUNTER — Other Ambulatory Visit (INDEPENDENT_AMBULATORY_CARE_PROVIDER_SITE_OTHER)

## 2023-09-16 ENCOUNTER — Encounter: Payer: Self-pay | Admitting: Gastroenterology

## 2023-09-16 ENCOUNTER — Other Ambulatory Visit: Payer: Self-pay | Admitting: Obstetrics and Gynecology

## 2023-09-16 ENCOUNTER — Ambulatory Visit (INDEPENDENT_AMBULATORY_CARE_PROVIDER_SITE_OTHER): Admitting: Gastroenterology

## 2023-09-16 VITALS — BP 110/56 | HR 100 | Ht 61.0 in | Wt 110.4 lb

## 2023-09-16 DIAGNOSIS — R110 Nausea: Secondary | ICD-10-CM | POA: Diagnosis not present

## 2023-09-16 DIAGNOSIS — M6250 Muscle wasting and atrophy, not elsewhere classified, unspecified site: Secondary | ICD-10-CM

## 2023-09-16 DIAGNOSIS — E43 Unspecified severe protein-calorie malnutrition: Secondary | ICD-10-CM | POA: Diagnosis not present

## 2023-09-16 DIAGNOSIS — R634 Abnormal weight loss: Secondary | ICD-10-CM | POA: Diagnosis not present

## 2023-09-16 DIAGNOSIS — K8681 Exocrine pancreatic insufficiency: Secondary | ICD-10-CM | POA: Diagnosis not present

## 2023-09-16 DIAGNOSIS — R928 Other abnormal and inconclusive findings on diagnostic imaging of breast: Secondary | ICD-10-CM

## 2023-09-16 DIAGNOSIS — R1010 Upper abdominal pain, unspecified: Secondary | ICD-10-CM | POA: Diagnosis not present

## 2023-09-16 DIAGNOSIS — E871 Hypo-osmolality and hyponatremia: Secondary | ICD-10-CM | POA: Diagnosis not present

## 2023-09-16 LAB — COMPREHENSIVE METABOLIC PANEL WITH GFR
ALT: 15 U/L (ref 0–35)
AST: 11 U/L (ref 0–37)
Albumin: 4.3 g/dL (ref 3.5–5.2)
Alkaline Phosphatase: 67 U/L (ref 39–117)
BUN: 20 mg/dL (ref 6–23)
CO2: 27 meq/L (ref 19–32)
Calcium: 9.8 mg/dL (ref 8.4–10.5)
Chloride: 104 meq/L (ref 96–112)
Creatinine, Ser: 1.07 mg/dL (ref 0.40–1.20)
GFR: 49.7 mL/min — ABNORMAL LOW (ref >60.00)
Glucose, Bld: 108 mg/dL — ABNORMAL HIGH (ref 70–99)
Potassium: 4 meq/L (ref 3.5–5.1)
Sodium: 138 meq/L (ref 135–145)
Total Bilirubin: 0.5 mg/dL (ref 0.2–1.2)
Total Protein: 6.8 g/dL (ref 6.0–8.3)

## 2023-09-16 MED ORDER — PANCRELIPASE (LIP-PROT-AMYL) 36000-114000 UNITS PO CPEP: ORAL_CAPSULE | ORAL | 11 refills | Status: DC

## 2023-09-16 NOTE — Patient Instructions (Addendum)
 VISIT SUMMARY:  Today, we discussed your significant weight loss, persistent nausea, and abdominal pain. We also reviewed your previous diagnostic evaluations and current medications.  YOUR PLAN:  WEIGHT LOSS AND MUSCLE LOSS: You have lost 30 pounds and muscle mass over the past few months, and we are concerned about your nutritional intake and absorption. -We will order a CT scan of your abdomen and pelvis with contrast to find out the cause of your weight loss. -Continue eating a high-calorie, high-protein diet including meat, baked potatoes, and ice cream as tolerated. -We will provide samples of pancreatic enzymes ((Creon  36000IU with each meal or snack) ) to help with digestion and absorption of nutrients. -Use Ensure and Gatorade to increase your caloric intake and stay hydrated.  PANCREATIC INSUFFICIENCY: We suspect that pancreatic insufficiency might be contributing to your weight loss and digestive issues. -We will provide samples of pancreatic enzymes (Creon  ) to see if they help. -Take the pancreatic enzymes with meals and substantial snacks. -We will collect a stool sample to evaluate your digestive function.  NAUSEA: You have persistent nausea without vomiting and have not been taking Zofran  regularly. -We will refill your Zofran  prescription for nausea management. -Use Zofran  only for severe nausea and not on a daily basis.  LOW SODIUM LEVELS: Your sodium levels have been low, likely due to medication use and excessive water intake, but they are improving with dietary adjustments. -Continue your current fluid intake recommendations of 50-60 ounces per day, including Gatorade. -We will monitor your sodium levels as needed.  You will be contacted by [facility] in the next 2 days to arrange a CT Abdomen/Pelvis.  The number on your caller ID will be [phone], please answer when they call.  If you have not heard from them in 2 days please call [phone] to  schedule.    Due to recent changes in healthcare laws, you may see the results of your imaging and laboratory studies on MyChart before your provider has had a chance to review them.  We understand that in some cases there may be results that are confusing or concerning to you. Not all laboratory results come back in the same time frame and the provider may be waiting for multiple results in order to interpret others.  Please give us  48 hours in order for your provider to thoroughly review all the results before contacting the office for clarification of your results.    Your provider has requested that you go to the basement level for lab work before leaving today. Press B on the elevator. The lab is located at the first door on the left as you exit the elevator.   I appreciate the  opportunity to care for you  Thank You   Kavitha Nandigam , MD

## 2023-09-16 NOTE — Progress Notes (Signed)
 Holly Nixon    [id number]    1945/07/06  Primary Care Physician:Hamrick, Charlene LITTIE, MD  Referring Physician: Stephanie Charlene LITTIE, MD [address] [place],  [place] [address]   Chief complaint: Abdominal pain, nausea, weight loss  Discussed the use of AI scribe software for clinical note transcription with the patient, who gave verbal consent to proceed.  History of Present Illness Holly Nixon is a 78 year old female who presents with significant weight loss and persistent nausea. She was referred by her primary doctor for evaluation of pancreatic issues.  Unintentional weight loss - Thirty-pound weight loss over the past few months - Poor appetite and difficulty eating - Attempting to increase caloric intake with foods such as baked potatoes, ice cream, and Ensure  Nausea and gastrointestinal symptoms - Persistent nausea without vomiting - Prescribed Zofran  for nausea but not taking it regularly - Difficulty with digestion - Stools float, suggesting possible malabsorption - Lactose intolerance but able to tolerate small amounts of ice cream  Abdominal pain - Constant abdominal pain located in the abdominal area - Lying on stomach sometimes alleviates pain - Pantoprazole  twice daily without symptom relief  Diagnostic evaluation - CT scan prior to colonoscopy showed no significant pancreatic findings - Referred to oncologist due to weight loss; no specific cancer testing performed  Medication and electrolyte management - Stopped citalopram and Miralax - Sodium levels improved after reducing water intake, previously low sodium attributed to excessive water consumption and medication interactions  [place] care [place] to medical appointments due to husband's condition and distance from medical facilities      Outpatient Encounter Medications as of 09/16/2023  Medication Sig   amLODipine (NORVASC) 5 MG  tablet Take 5 mg by mouth daily.   estradiol (ESTRACE) 0.5 MG tablet Take 0.5 mg by mouth daily.   levocetirizine (XYZAL) 5 MG tablet Take 5 mg by mouth every evening.   nitroGLYCERIN  (NITROSTAT ) 0.4 MG SL tablet Place 0.4 mg under the tongue every 5 (five) minutes as needed for chest pain.   Olopatadine HCl (PATADAY) 0.2 % SOLN as needed.   pantoprazole  (PROTONIX ) 40 MG tablet Take 1 tablet (40 mg total) by mouth 2 (two) times daily. Take for 90 days.   rosuvastatin  (CRESTOR ) 10 MG tablet TAKE 1 TABLET DAILY   Wheat Dextrin (BENEFIBER DRINK MIX PO) Take 1 Dose by mouth daily. 1 teaspoon   zolpidem (AMBIEN) 10 MG tablet Take 10 mg by mouth at bedtime.   acyclovir (ZOVIRAX) 400 MG tablet Take 400 mg by mouth every other day. Monday , Wednesday and Friday (Patient not taking: Reported on 09/16/2023)   No facility-administered encounter medications on file as of 09/16/2023.    Allergies as of 09/16/2023 - Review Complete 09/16/2023  Allergen Reaction Noted   Aspirin Anaphylaxis 04/08/2008   Nsaids Anaphylaxis 04/08/2008   Other Hives 08/08/2018   Penicillins Rash 04/08/2008   Sulfonamide derivatives Other (See Comments) 04/08/2008    Past Medical History:  Diagnosis Date   Allergy    SEASONAL   Anxiety    Arthritis    back,hands   Asthma    took shots   Bilateral sensorineural hearing loss 07/14/2019   Breast lump 08/29/2020   Chest pain, unspecified 09/27/2020   Chronic kidney disease    Stage 3/ moderate   Depression    Diabetes mellitus, type II (HCC)  Environmental allergies    Essential hypertension 06/12/2015   Fatigue 06/29/2015   Glaucoma    Hypercholesterolemia    Rectocele    Sleep apnea, obstructive 06/29/2015   Has CPAP    Past Surgical History:  Procedure Laterality Date   ABDOMINAL HYSTERECTOMY  1992   total   COLONOSCOPY     TUBAL LIGATION      Family History  Problem Relation Age of Onset   Cancer Mother        appendix   Stroke Mother     Dementia Mother    Heart disease Father    Heart attack Father    Coronary artery disease Father    Congestive Heart Failure Sister    Diabetes Sister    Congenital heart disease Brother    Stroke Maternal Grandmother    Lymphoma Daughter    Esophageal cancer Neg Hx    Rectal cancer Neg Hx    Stomach cancer Neg Hx    Colon cancer Neg Hx     Social History   Socioeconomic History   Marital status: Married    Spouse name: Jayson   Number of children: 1   Years of education: 18   Highest education level: Not on file  Occupational History   Occupation: retired  Tobacco Use   Smoking status: Former    Current packs/day: 0.00    Types: Cigarettes    Quit date: 03/05/1978    Years since quitting: 45.5   Smokeless tobacco: Never  Vaping Use   Vaping status: Never Used  Substance and Sexual Activity   Alcohol use: Yes    Alcohol/week: 2.0 standard drinks of alcohol    Types: 2 Shots of liquor per week    Comment: rarely   Drug use: No   Sexual activity: Not Currently    Birth control/protection: Post-menopausal  Other Topics Concern   Not on file  Social History Narrative   Lives in [place] with husband.    Social Drivers of Corporate investment banker Strain: Not on file  Food Insecurity: No Food Insecurity (11/15/2021)   Hunger Vital Sign    Worried About Running Out of Food in the Last Year: Never true    Ran Out of Food in the Last Year: Never true  Transportation Needs: No Transportation Needs (11/15/2021)   PRAPARE - Administrator, Civil Service (Medical): No    Lack of Transportation (Non-Medical): No  Physical Activity: Sufficiently Active (05/04/2021)   Received from [facility]   Exercise Vital Sign    On average, how many days per week do you engage in moderate to strenuous exercise (like a brisk walk)?: 5 days    On average, how many minutes do you engage in exercise at this level?: 30 min  Stress: Not on file  Social Connections: Not on  file  Intimate Partner Violence: Not on file      Review of systems: All other review of systems negative except as mentioned in the HPI.   Physical Exam: Vitals:   09/16/23 1029  BP: (!) 110/56  Pulse: 100   Body mass index is 20.86 kg/m. Gen:      No acute distress HEENT:  sclera anicteric Abd:      soft, non-tender; no palpable masses, no distension Ext:    No edema Neuro: alert and oriented x 3 Psych: normal mood and affect  Data Reviewed:  Reviewed labs, radiology imaging, old records and pertinent  past GI work up     Assessment and Plan Assessment & Plan Weight loss and muscle loss Significant weight loss of 30 pounds and muscle loss over the past few months, with concerns about nutritional intake and absorption due to suspected pancreatic insufficiency. Difficulty in maintaining weight despite increased caloric intake. Previous CT scan did not reveal pancreatic abnormalities.  EGD and colon negative for pathology to explain unintentional weight loss  - Order CT scan of abdomen and pelvis with contrast to evaluate for underlying causes of weight loss. - Encourage high-calorie, high-protein diet including meat, baked potatoes, and ice cream as tolerated. - Provide samples of pancreatic enzymes (Creon  36000IU with each meal or snack) to aid digestion and absorption of nutrients. - Encourage use of Ensure and Gatorade to increase caloric intake and hydration.  Pancreatic insufficiency Suspected pancreatic insufficiency contributing to weight loss and digestive issues. She has not previously tried Creon  or Zenpep . - Provide samples of pancreatic enzymes (Creon  or Zenpep ) to assess tolerance and effectiveness. - Instruct to take pancreatic enzymes with meals and substantial snacks. - Collect stool sample for analysis to evaluate digestive function.  Nausea Persistent nausea without vomiting. She has Zofran  available but has not been taking it due to instructions  about limited use and uncertainty about refills. - Refill Zofran  prescription for nausea management as needed. - Advise to use Zofran  only for severe nausea and not on a daily basis.  Low sodium levels Low sodium levels potentially related to medication use and excessive water intake. Sodium levels improving with dietary adjustments. - Continue current fluid intake recommendations of 50-60 ounces per day, including Gatorade. - Monitor sodium levels as needed.  Myelofibrosis Previous diagnosis of myelofibrosis 20 years ago, now considered unlikely due to lack of symptoms and progression.     The patient was provided an opportunity to ask questions and all were answered. The patient agreed with the plan and demonstrated an understanding of the instructions.  Holly Nixon , MD    CC: Hamrick, Maura L, MD

## 2023-09-17 DIAGNOSIS — R002 Palpitations: Secondary | ICD-10-CM | POA: Diagnosis not present

## 2023-09-18 ENCOUNTER — Ambulatory Visit
Admission: RE | Admit: 2023-09-18 | Discharge: 2023-09-18 | Disposition: A | Discharge status: Home / Self Care | Source: Ambulatory Visit | Attending: Obstetrics and Gynecology | Admitting: Obstetrics and Gynecology

## 2023-09-18 ENCOUNTER — Other Ambulatory Visit

## 2023-09-18 ENCOUNTER — Inpatient Hospital Stay
Admission: RE | Admit: 2023-09-18 | Discharge: 2023-09-18 | Discharge status: Home / Self Care | Source: Ambulatory Visit | Attending: Obstetrics and Gynecology | Admitting: Obstetrics and Gynecology

## 2023-09-18 DIAGNOSIS — R1010 Upper abdominal pain, unspecified: Secondary | ICD-10-CM | POA: Diagnosis not present

## 2023-09-18 DIAGNOSIS — R928 Other abnormal and inconclusive findings on diagnostic imaging of breast: Secondary | ICD-10-CM | POA: Diagnosis not present

## 2023-09-18 DIAGNOSIS — R634 Abnormal weight loss: Secondary | ICD-10-CM | POA: Diagnosis not present

## 2023-09-18 DIAGNOSIS — E43 Unspecified severe protein-calorie malnutrition: Secondary | ICD-10-CM | POA: Diagnosis not present

## 2023-09-18 DIAGNOSIS — R110 Nausea: Secondary | ICD-10-CM

## 2023-09-19 ENCOUNTER — Telehealth: Payer: Self-pay | Admitting: Gastroenterology

## 2023-09-19 NOTE — Telephone Encounter (Signed)
 Inbound call from patient stated she would like to speak to nurse in regards to some sample medications Dr.Nandigam is having her take. Patient stated Creon  medication is to be taken before food and she would like to know if she can take both Creon  and Protonix  before food since she usually would drink Protonix  before food. Requesting a call back  Please advise  Thank you

## 2023-09-19 NOTE — Telephone Encounter (Signed)
 Patient returned call. Discussed medication usage. Educated patient that Creon  should be taken with the first bite of each meal. She should Pantoprazole  about 30 minutes prior to her first meal of the day. Patient verbalized understanding and agrees with plan.

## 2023-09-19 NOTE — Telephone Encounter (Signed)
 Attempted to reach patient. No answer, left VM for patient to return call.

## 2023-09-20 ENCOUNTER — Ambulatory Visit (HOSPITAL_COMMUNITY)
Admission: RE | Admit: 2023-09-20 | Discharge: 2023-09-20 | Disposition: A | Discharge status: Home / Self Care | Source: Ambulatory Visit | Attending: Gastroenterology | Admitting: Gastroenterology

## 2023-09-20 DIAGNOSIS — R110 Nausea: Secondary | ICD-10-CM | POA: Diagnosis not present

## 2023-09-20 DIAGNOSIS — R1010 Upper abdominal pain, unspecified: Secondary | ICD-10-CM | POA: Diagnosis not present

## 2023-09-20 DIAGNOSIS — R634 Abnormal weight loss: Secondary | ICD-10-CM | POA: Insufficient documentation

## 2023-09-20 DIAGNOSIS — R112 Nausea with vomiting, unspecified: Secondary | ICD-10-CM | POA: Diagnosis not present

## 2023-09-20 DIAGNOSIS — R109 Unspecified abdominal pain: Secondary | ICD-10-CM | POA: Diagnosis not present

## 2023-09-20 DIAGNOSIS — E43 Unspecified severe protein-calorie malnutrition: Secondary | ICD-10-CM | POA: Diagnosis not present

## 2023-09-20 DIAGNOSIS — K769 Liver disease, unspecified: Secondary | ICD-10-CM | POA: Diagnosis not present

## 2023-09-20 MED ORDER — IOHEXOL 300 MG/ML  SOLN
100.0000 mL | Freq: Once | INTRAMUSCULAR | Status: AC | PRN
  Administered 2023-09-20: 100 mL via INTRAVENOUS

## 2023-09-23 ENCOUNTER — Telehealth: Payer: Self-pay

## 2023-09-23 ENCOUNTER — Other Ambulatory Visit: Payer: Self-pay | Admitting: Cardiology

## 2023-09-23 ENCOUNTER — Encounter: Payer: Self-pay | Admitting: Gastroenterology

## 2023-09-23 ENCOUNTER — Ambulatory Visit: Payer: Self-pay | Admitting: Gastroenterology

## 2023-09-23 LAB — PANCREATIC ELASTASE, FECAL: Pancreatic Elastase-1, Stool: 375 ug/g (ref >200)

## 2023-09-23 NOTE — Telephone Encounter (Signed)
 Please advise patient to continue pantoprazole , she does not have to wean herself off pantoprazole .  She can stop Creon  if she is not tolerating it and start stool softener with goal of 1-2 soft bowel movements per day.

## 2023-09-23 NOTE — Telephone Encounter (Signed)
 Patient returned call. She has several questions for Dr. Nandigam that she would like addressed. Patient states that she is trying to wean herself off of the Pantoprazole . She is taking 1 tablet q am, and 1 tablet every other night. She reports that since she has started weaning the dose, she has noticed an increase burning in her stomach area. Patient also reports that she would like to stop taking the Creon . She reports that it will be very expensive for her to continue and it is hard to swallow the medication. Did discuss option of opening capsule and pouring over food like applesauce. Patient advised that she could stop taking the Creon , voiced multiple concerns regarding just stopping the medication. Patient also voices concern that she hasn't had a bm since last Monday. Patient states that she hasn't been taking her stool softener because she was concerned there would be interactions between the stool softener and the Creon . Recommended patient restart of stool softener. She verbalized understanding.

## 2023-09-24 NOTE — Telephone Encounter (Signed)
 Spoke with patient. Discussed provider recommendations. Patient reports that she is concerned because she hasn't had a bm since last Friday. She reports that she did a mineral oil enema this am and nothing came out. Recommended patient start stool softener. Patient continues to voice concern as she is worried that this will taking this will deplete her sodium levels. Discussed with patient increasing sodium intake through her diet. She reports that she will put a little shake of salt on her food occasionally. Discussed possibility of doing Miralax purge if patient felt constipated. Patient voiced concern about doing an entire purge. Recommended trying 1 dose to see if that would help patient have bm and discussed mixing with Gatorade to increase sodium intake. Patient reports that she is eating 1500-2000 cals per day with meals. She is unsure what else to do at this point in regards to the continued weight loss. She does report trying to eat larger amounts of protein to encourage weight gain. Patient has appointment with PCP on Thursday and she was encouraged to speak further with her PCP on what else can be done regarding this.

## 2023-09-26 DIAGNOSIS — M6281 Muscle weakness (generalized): Secondary | ICD-10-CM | POA: Diagnosis not present

## 2023-09-26 DIAGNOSIS — F419 Anxiety disorder, unspecified: Secondary | ICD-10-CM | POA: Diagnosis not present

## 2023-09-26 DIAGNOSIS — K5900 Constipation, unspecified: Secondary | ICD-10-CM | POA: Diagnosis not present

## 2023-09-26 DIAGNOSIS — G4700 Insomnia, unspecified: Secondary | ICD-10-CM | POA: Diagnosis not present

## 2023-09-26 DIAGNOSIS — E871 Hypo-osmolality and hyponatremia: Secondary | ICD-10-CM | POA: Diagnosis not present

## 2023-09-26 DIAGNOSIS — R5383 Other fatigue: Secondary | ICD-10-CM | POA: Diagnosis not present

## 2023-09-26 DIAGNOSIS — R634 Abnormal weight loss: Secondary | ICD-10-CM | POA: Diagnosis not present

## 2023-09-30 NOTE — Progress Notes (Signed)
 Spoke with patient, discussed negative stool test and stopping Creon . Patient reports that she has already stopped this medication as she and I previously discussed. She verbalized understanding of all instructions.

## 2023-10-02 ENCOUNTER — Other Ambulatory Visit: Payer: Self-pay | Admitting: Cardiology

## 2023-10-02 MED ORDER — ROSUVASTATIN CALCIUM 10 MG PO TABS: 10.0000 mg | ORAL_TABLET | Freq: Every day | ORAL | 3 refills | Status: AC

## 2023-10-03 ENCOUNTER — Telehealth: Payer: Self-pay | Admitting: Gastroenterology

## 2023-10-03 NOTE — Telephone Encounter (Signed)
 Inbound call from patient stating that she had spoke with one of her friends and her friend advised her to call our office to see if she can have a HIDA scan with gallbladder injection fraction. Patient is requesting a call to discuss. Please advise.

## 2023-10-04 NOTE — Telephone Encounter (Signed)
 Called and spoke with patient. Relayed provider message. Patient verbalized understanding. Appointment scheduled for 11/27/2023 at 1010 AM.

## 2023-10-04 NOTE — Telephone Encounter (Signed)
 HIDA is unlikely to be beneficial, she has normal gallbladder imaging and normal LFT. Please schedule f/u with me or APP next available. Thanks

## 2023-10-16 DIAGNOSIS — H353131 Nonexudative age-related macular degeneration, bilateral, early dry stage: Secondary | ICD-10-CM | POA: Diagnosis not present

## 2023-10-16 DIAGNOSIS — H2513 Age-related nuclear cataract, bilateral: Secondary | ICD-10-CM | POA: Diagnosis not present

## 2023-10-16 DIAGNOSIS — H52203 Unspecified astigmatism, bilateral: Secondary | ICD-10-CM | POA: Diagnosis not present

## 2023-10-23 DIAGNOSIS — R634 Abnormal weight loss: Secondary | ICD-10-CM | POA: Diagnosis not present

## 2023-10-23 DIAGNOSIS — E871 Hypo-osmolality and hyponatremia: Secondary | ICD-10-CM | POA: Diagnosis not present

## 2023-10-23 DIAGNOSIS — R350 Frequency of micturition: Secondary | ICD-10-CM | POA: Diagnosis not present

## 2023-10-31 ENCOUNTER — Ambulatory Visit: Admitting: Behavioral Health

## 2023-11-07 DIAGNOSIS — Z23 Encounter for immunization: Secondary | ICD-10-CM | POA: Diagnosis not present

## 2023-11-27 ENCOUNTER — Encounter: Payer: Self-pay | Admitting: Gastroenterology

## 2023-11-27 ENCOUNTER — Ambulatory Visit: Admitting: Gastroenterology

## 2023-11-27 VITALS — BP 124/70 | HR 88 | Wt 117.2 lb

## 2023-11-27 DIAGNOSIS — K5909 Other constipation: Secondary | ICD-10-CM | POA: Diagnosis not present

## 2023-11-27 DIAGNOSIS — R634 Abnormal weight loss: Secondary | ICD-10-CM | POA: Diagnosis not present

## 2023-11-27 DIAGNOSIS — K581 Irritable bowel syndrome with constipation: Secondary | ICD-10-CM

## 2023-11-27 DIAGNOSIS — R739 Hyperglycemia, unspecified: Secondary | ICD-10-CM | POA: Diagnosis not present

## 2023-11-27 DIAGNOSIS — R109 Unspecified abdominal pain: Secondary | ICD-10-CM

## 2023-11-27 DIAGNOSIS — E871 Hypo-osmolality and hyponatremia: Secondary | ICD-10-CM | POA: Diagnosis not present

## 2023-11-27 DIAGNOSIS — E43 Unspecified severe protein-calorie malnutrition: Secondary | ICD-10-CM

## 2023-11-27 DIAGNOSIS — G8929 Other chronic pain: Secondary | ICD-10-CM

## 2023-11-27 DIAGNOSIS — R61 Generalized hyperhidrosis: Secondary | ICD-10-CM

## 2023-11-27 DIAGNOSIS — R7989 Other specified abnormal findings of blood chemistry: Secondary | ICD-10-CM | POA: Diagnosis not present

## 2023-11-27 NOTE — Progress Notes (Signed)
 Holly Nixon    [id number]    1945/05/20  Primary Care Physician:Hamrick, Charlene LITTIE, MD  Referring Physician: Stephanie Charlene LITTIE, MD [address] [place],  [place] [address]   Chief complaint:  Fatigue, weight loss  Discussed the use of AI scribe software for clinical note transcription with the patient, who gave verbal consent to proceed.  History of Present Illness Holly Nixon is a 78 year old female who presents with persistent gastrointestinal symptoms including nausea and abdominal pain.  Gastrointestinal symptoms - Persistent nausea and abdominal pain - Abdominal pain described as 'parentheses' around the abdomen with occasional radiation to the back - Unusual symptoms include simultaneous urge to burp, sneeze, and yawn - No diarrhea - Stools are fragmented and not fully evacuated - No significant findings on prior colonoscopy and endoscopy  Appetite and weight changes - Significant weight loss of 10 to 15 pounds over the past year, with recent stabilization - Early satiety and poor appetite, limiting intake to approximately 1500 calories daily - Difficulty consuming high-calorie foods such as baked potatoes with butter and Ensure drinks - Ongoing concerns about maintaining adequate nutrition  Bowel habits and laxative use - Fragmented stools with sensation of incomplete evacuation - Uses Miralax to aid bowel movements - Hesitant to use Benefiber due to fear of constipation  Hydration and electrolyte concerns - Fluid intake approximately 52 ounces daily - Concerns about low sodium levels attributed to dehydration and insufficient intake - Ongoing concerns about maintaining adequate hydration  Night sweats and constitutional symptoms - Night sweats began around the same time as weight loss - No recent travel or tuberculosis exposure - Negative TB test  Medication history - Current use of Miralax - Occasional use of Zofran  for nausea - Creon   and pantoprazole  discontinued due to lack of efficacy - Other medications have been stopped  Liver and fibrosis evaluation - History of low fibrosis diagnosed 20 years ago, but not confirmed on subsequent MRI - Liver function tests have been normal in the past - Various tests including CT scan performed without significant findings      Outpatient Encounter Medications as of 11/27/2023  Medication Sig   acyclovir (ZOVIRAX) 400 MG tablet Take 400 mg by mouth every other day. Monday , Wednesday and Friday   amLODipine (NORVASC) 5 MG tablet Take 5 mg by mouth daily.   estradiol (ESTRACE) 0.5 MG tablet Take 0.5 mg by mouth daily.   levocetirizine (XYZAL) 5 MG tablet Take 5 mg by mouth every evening.   nitroGLYCERIN  (NITROSTAT ) 0.4 MG SL tablet Place 0.4 mg under the tongue every 5 (five) minutes as needed for chest pain.   Olopatadine HCl (PATADAY) 0.2 % SOLN as needed.   polyethylene glycol powder (MIRALAX) 17 GM/SCOOP powder Take 17 g by mouth daily. Dissolve 1 capful (17g) in 4-8 ounces of liquid and take by mouth daily.   rosuvastatin  (CRESTOR ) 10 MG tablet Take 1 tablet (10 mg total) by mouth daily.   Wheat Dextrin (BENEFIBER DRINK MIX PO) Take 1 Dose by mouth daily. 1 teaspoon   [DISCONTINUED] lipase/protease/amylase (CREON ) 36000 UNITS CPEP capsule Take 1 capsule (36,000 Units total) by mouth 3 (three) times daily before meals AND 1 capsule (36,000 Units total) daily.   [DISCONTINUED] pantoprazole  (PROTONIX ) 40 MG tablet Take 1 tablet (40 mg total) by mouth 2 (two) times daily. Take for 90 days.   [DISCONTINUED] zolpidem (AMBIEN) 10 MG tablet Take 10  mg by mouth at bedtime.   No facility-administered encounter medications on file as of 11/27/2023.    Allergies as of 11/27/2023 - Review Complete 11/27/2023  Allergen Reaction Noted   Aspirin Anaphylaxis 04/08/2008   Nsaids Anaphylaxis 04/08/2008   Other Hives 08/08/2018   Penicillins Rash 04/08/2008   Sulfonamide derivatives Other  (See Comments) 04/08/2008    Past Medical History:  Diagnosis Date   Allergy    SEASONAL   Anxiety    Arthritis    back,hands   Asthma    took shots   Bilateral sensorineural hearing loss 07/14/2019   Breast lump 08/29/2020   Chest pain, unspecified 09/27/2020   Chronic kidney disease    Stage 3/ moderate   Depression    Diabetes mellitus, type II (HCC)    Environmental allergies    Essential hypertension 06/12/2015   Fatigue 06/29/2015   Glaucoma    Hypercholesterolemia    Rectocele    Sleep apnea, obstructive 06/29/2015   Has CPAP    Past Surgical History:  Procedure Laterality Date   ABDOMINAL HYSTERECTOMY  1992   total   COLONOSCOPY     TUBAL LIGATION      Family History  Problem Relation Age of Onset   Cancer Mother        appendix   Stroke Mother    Dementia Mother    Heart disease Father    Heart attack Father    Coronary artery disease Father    Congestive Heart Failure Sister    Diabetes Sister    Congenital heart disease Brother    Stroke Maternal Grandmother    Lymphoma Daughter    Esophageal cancer Neg Hx    Rectal cancer Neg Hx    Stomach cancer Neg Hx    Colon cancer Neg Hx     Social History   Socioeconomic History   Marital status: Married    Spouse name: Holly Nixon   Number of children: 1   Years of education: 18   Highest education level: Not on file  Occupational History   Occupation: retired  Tobacco Use   Smoking status: Former    Current packs/day: 0.00    Types: Cigarettes    Quit date: 03/05/1978    Years since quitting: 45.7   Smokeless tobacco: Never  Vaping Use   Vaping status: Never Used  Substance and Sexual Activity   Alcohol use: Not Currently    Alcohol/week: 2.0 standard drinks of alcohol    Types: 2 Shots of liquor per week    Comment: rarely   Drug use: No   Sexual activity: Not Currently    Birth control/protection: Post-menopausal  Other Topics Concern   Not on file  Social History Narrative   Lives  in [place] with husband.    Social Drivers of Corporate investment banker Strain: Not on file  Food Insecurity: No Food Insecurity (11/15/2021)   Hunger Vital Sign    Worried About Running Out of Food in the Last Year: Never true    Ran Out of Food in the Last Year: Never true  Transportation Needs: No Transportation Needs (11/15/2021)   PRAPARE - Administrator, Civil Service (Medical): No    Lack of Transportation (Non-Medical): No  Physical Activity: Sufficiently Active (05/04/2021)   Received from [facility]   Exercise Vital Sign    On average, how many days per week do you engage in moderate to strenuous exercise (like a brisk  walk)?: 5 days    On average, how many minutes do you engage in exercise at this level?: 30 min  Stress: Not on file  Social Connections: Not on file  Intimate Partner Violence: Not on file      Review of systems: All other review of systems negative except as mentioned in the HPI.   Physical Exam: Vitals:   11/27/23 1008  BP: 124/70  Pulse: 88   Body mass index is 22.15 kg/m. Gen:      No acute distress HEENT:  sclera anicteric CV: s1s2 rrr, no murmur Lungs: B/l clear. Abd:      soft, non-tender; no palpable masses, no distension Ext:    No edema Neuro: alert and oriented x 3 Psych: normal mood and affect  Data Reviewed:  Reviewed labs, radiology imaging, old records and pertinent past GI work up   Assessment and Plan Assessment & Plan Functional abdominal pain with gastrointestinal symptoms Chronic functional abdominal pain with nausea, burping, and dyspepsia. Normal CT scan, endoscopy, and colonoscopy indicate a functional issue. Symptoms cause significant discomfort but are not life-threatening. Extensive testing has ruled out malignancy. HIDA scan deemed unnecessary due to normal liver function tests and CT scan results. She is apprehensive about the radioactive dye involved in HIDA scan. - Recommend lifestyle and  dietary changes, including a balanced diet with fruits, vegetables, and proteins. - Prescribe FD Guard for upper gastrointestinal symptoms and Ibogard for bowel spasms. - Refer to a nutritionist for dietary guidance. - Offer referral to another gastroenterologist for a second opinion if desired.  Chronic constipation with incomplete evacuation Chronic constipation with fragmented stools and need to wear a pad. Insufficient water intake contributes to symptoms. Miralax is used but may need adjustment based on bowel movement frequency. - Increase water intake to at least 64 ounces per day. - Use Benefiber with each meal to increase fiber intake. - Continue Miralax and adjust dosage as needed based on bowel movement frequency.  Unintentional weight loss and poor appetite Significant weight loss over the past year with poor appetite. Current caloric intake is insufficient for weight gain. Weight has stabilized recently with slight gain. She struggles with appetite and fullness, making it difficult to increase intake. - Encourage small, frequent meals and snacks throughout the day. - Increase caloric intake to 2200-2500 calories per day. - Refer to a nutritionist for assistance with meal planning and dietary adjustments. - Consider Ensure as a nutritional supplement for additional calories.  Night sweats, ongoing evaluation Persistent night sweats for the past 6-7 months. Extensive testing including CBC, liver function tests, and imaging have not indicated any underlying malignancy or infection. Symptoms are concerning but not currently linked to any specific diagnosis.  Recording duration: 31 minutes      This visit required *** minutes of patient care (this includes precharting, chart review, review of results, face-to-face time used for counseling as well as treatment plan and follow-up. The patient was provided an opportunity to ask questions and all were answered. The patient agreed with  the plan and demonstrated an understanding of the instructions.  LOIS Wilkie Mcgee , MD    CC: Hamrick, Maura L, MD

## 2023-11-27 NOTE — Patient Instructions (Addendum)
 VISIT SUMMARY:  During your visit, we discussed your persistent gastrointestinal symptoms, including nausea, abdominal pain, and constipation. We also addressed your significant weight loss, poor appetite, and night sweats. We have developed a comprehensive plan to manage your symptoms and improve your overall health.  YOUR PLAN:  FUNCTIONAL ABDOMINAL PAIN WITH GASTROINTESTINAL SYMPTOMS: You have chronic abdominal pain with nausea, burping, and indigestion. Extensive testing has ruled out serious conditions, indicating a functional issue. -Make lifestyle and dietary changes, including eating a balanced diet with fruits, vegetables, and proteins. -Take FD Gard for upper gastrointestinal symptoms and IBgard for bowel spasms. -We will refer you to a nutritionist for dietary guidance. -As per your request , we will refer you to another gastroenterologist at [facility] for a second opinion.  CHRONIC CONSTIPATION WITH INCOMPLETE EVACUATION: You have chronic constipation with fragmented stools and a sensation of incomplete evacuation. Your water intake is currently insufficient. -Increase your water intake to at least 64 ounces per day. -Use Benefiber with each meal to increase your fiber intake. -Continue using Miralax and adjust the dosage as needed based on your bowel movement frequency.  UNINTENTIONAL WEIGHT LOSS AND POOR APPETITE: You have experienced significant weight loss over the past year and have a poor appetite. Your current caloric intake is insufficient for weight gain. -Eat small, frequent meals and snacks throughout the day. -Increase your caloric intake to 2200-2500 calories per day. -We will refer you to a nutritionist for assistance with meal planning and dietary adjustments. -Consider using Ensure as a nutritional supplement for additional calories.  NIGHT SWEATS, ONGOING EVALUATION: You have been experiencing night sweats for the past 6-7 months. Extensive testing has not indicated any  underlying serious condition. -We will continue to monitor your symptoms and conduct further evaluations as needed.  I appreciate the  opportunity to care for you  Thank You   Kavitha Nandigam , MD

## 2023-11-28 ENCOUNTER — Encounter: Payer: Self-pay | Admitting: Gastroenterology

## 2023-11-29 ENCOUNTER — Telehealth: Payer: Self-pay | Admitting: Gastroenterology

## 2023-11-29 NOTE — Telephone Encounter (Signed)
 Returned call to patient. Recommended pt to call [facility] at [phone]. Also encouraged pt to call her insurance company to determine what types of nutrition services are covered by them. Pt verbalized understanding.

## 2023-11-29 NOTE — Telephone Encounter (Signed)
 Inbound call from patient stating she was referred to a [facility] but when calling office they stated her insurance would not cover her visit due to not having diabetes and not having level 3 chronic kidney disease. Patient would like to know if she can be referred elsewhere.  Requesting a call back  Please advise  Thank you

## 2023-12-02 ENCOUNTER — Telehealth: Payer: Self-pay | Admitting: *Deleted

## 2023-12-02 NOTE — Telephone Encounter (Signed)
 New patient referral form filled out and faxed to [facility] [phone]

## 2023-12-11 DIAGNOSIS — L821 Other seborrheic keratosis: Secondary | ICD-10-CM | POA: Diagnosis not present

## 2023-12-11 DIAGNOSIS — D225 Melanocytic nevi of trunk: Secondary | ICD-10-CM | POA: Diagnosis not present

## 2023-12-11 DIAGNOSIS — L853 Xerosis cutis: Secondary | ICD-10-CM | POA: Diagnosis not present

## 2023-12-13 ENCOUNTER — Telehealth: Payer: Self-pay | Admitting: Gastroenterology

## 2023-12-13 NOTE — Telephone Encounter (Signed)
 Inbound call from patient stating she called [facility] for 2nd opinion appointment like Dr.Nandigam advised her to do and they stated they do not have appointments for 2nd opinions. Patient then tried [facility] but hasn't heard back and is not sure if she needs referral sent. Please advise  Thank you

## 2023-12-13 NOTE — Telephone Encounter (Signed)
 Spoke with the patient. She has left 2 messages in the voicemail of [facility] without response. Patient desires to see GI at [facility]. I am not sure why she is calling Ms [facility]. It appears she is connected with the nedical school at [facility]. Referral faxed to [facility] at [phone]. Patient aware.

## 2023-12-16 NOTE — Telephone Encounter (Signed)
 It seems as though patient wants to see [facility], Beth faxed referral

## 2023-12-20 DIAGNOSIS — H2513 Age-related nuclear cataract, bilateral: Secondary | ICD-10-CM | POA: Diagnosis not present

## 2023-12-23 DIAGNOSIS — Z23 Encounter for immunization: Secondary | ICD-10-CM | POA: Diagnosis not present

## 2024-01-01 DIAGNOSIS — E782 Mixed hyperlipidemia: Secondary | ICD-10-CM | POA: Diagnosis not present

## 2024-01-01 DIAGNOSIS — R61 Generalized hyperhidrosis: Secondary | ICD-10-CM | POA: Diagnosis not present

## 2024-01-01 DIAGNOSIS — R634 Abnormal weight loss: Secondary | ICD-10-CM | POA: Diagnosis not present

## 2024-01-01 DIAGNOSIS — E871 Hypo-osmolality and hyponatremia: Secondary | ICD-10-CM | POA: Diagnosis not present

## 2024-01-01 DIAGNOSIS — R7989 Other specified abnormal findings of blood chemistry: Secondary | ICD-10-CM | POA: Diagnosis not present

## 2024-01-01 DIAGNOSIS — F419 Anxiety disorder, unspecified: Secondary | ICD-10-CM | POA: Diagnosis not present

## 2024-01-23 DIAGNOSIS — H25812 Combined forms of age-related cataract, left eye: Secondary | ICD-10-CM | POA: Diagnosis not present

## 2024-01-23 DIAGNOSIS — Z961 Presence of intraocular lens: Secondary | ICD-10-CM | POA: Diagnosis not present

## 2024-01-23 DIAGNOSIS — H2512 Age-related nuclear cataract, left eye: Secondary | ICD-10-CM | POA: Diagnosis not present

## 2024-02-06 DIAGNOSIS — H2511 Age-related nuclear cataract, right eye: Secondary | ICD-10-CM | POA: Diagnosis not present

## 2024-02-06 DIAGNOSIS — Z961 Presence of intraocular lens: Secondary | ICD-10-CM | POA: Diagnosis not present

## 2024-02-06 DIAGNOSIS — H25811 Combined forms of age-related cataract, right eye: Secondary | ICD-10-CM | POA: Diagnosis not present

## 2024-02-19 ENCOUNTER — Ambulatory Visit (INDEPENDENT_AMBULATORY_CARE_PROVIDER_SITE_OTHER): Admitting: Gastroenterology

## 2024-02-19 ENCOUNTER — Encounter: Payer: Self-pay | Admitting: Gastroenterology

## 2024-02-19 VITALS — BP 124/76 | HR 85 | Ht 61.0 in | Wt 124.1 lb

## 2024-02-19 DIAGNOSIS — M6289 Other specified disorders of muscle: Secondary | ICD-10-CM

## 2024-02-19 DIAGNOSIS — K581 Irritable bowel syndrome with constipation: Secondary | ICD-10-CM

## 2024-02-19 DIAGNOSIS — K5909 Other constipation: Secondary | ICD-10-CM | POA: Diagnosis not present

## 2024-02-19 DIAGNOSIS — R1013 Epigastric pain: Secondary | ICD-10-CM

## 2024-02-19 MED ORDER — FAMOTIDINE 20 MG PO TABS: 20.0000 mg | ORAL_TABLET | Freq: Two times a day (BID) | ORAL | 5 refills | Status: DC

## 2024-02-19 NOTE — Patient Instructions (Addendum)
 VISIT SUMMARY:  Today, we discussed your chronic constipation, abdominal discomfort, and related gastrointestinal issues. We reviewed your current medications and dietary habits and made some adjustments to help manage your symptoms more effectively.  YOUR PLAN:  CHRONIC CONSTIPATION WITH INCOMPLETE EVACUATION: You have chronic constipation with small, hard stools and incomplete evacuation. -Start taking Linzess 72 mcg daily on an empty stomach in the morning. -If Linzess works, you can stop taking Miralax. -If Linzess does not work, you can add Miralax or increase Linzess to two capsules daily. -Stop taking Linzess if you experience diarrhea or cramping. -Try to drink 64 ounces of water daily if you can.  PELVIC FLOOR DYSFUNCTION: You have symptoms of incomplete evacuation and pasty residue, which may be due to pelvic floor dysfunction. -You are referred to pelvic floor physical therapy to learn exercises and techniques to help with sphincter relaxation. -Use a step stool to help with bowel movements.  CHRONIC GASTRITIS AND DYSPEPSIA: You have chronic stomach discomfort that may be related to gastritis or dyspepsia. -Take Pepcid  20 mg up to twice daily as needed for symptom relief. -Avoid lying down immediately after meals; wait at least 2-3 hours before lying down. -Consider changing your diet to avoid foods and drinks that make your symptoms worse, like soda.  GASTROESOPHAGEAL REFLUX DISEASE (GERD): You have symptoms of GERD, which may be causing your stomach discomfort, especially at night. -Avoid lying down within 2-3 hours after eating to reduce reflux. -Take Pepcid  before breakfast and dinner to help manage symptoms.

## 2024-02-19 NOTE — Progress Notes (Signed)
 MILAN CLARE    [id number]    03-07-1945  Primary Care Physician:Hamrick, Charlene LITTIE, MD  Referring Physician: Stephanie Charlene LITTIE, MD [address] [place],  [place] [address]   Chief complaint:  Abdominal pain, constipation  Discussed the use of AI scribe software for clinical note transcription with the patient, who gave verbal consent to proceed.  History of Present Illness KENETHA COZZA is a 78 year old female who presents with chronic constipation and abdominal discomfort.  Chronic constipation - Chronic constipation despite Miralax one to two times daily and Benefiber three times daily - Difficulty managing medication timing with meals and fluids - Bowel movements once or twice daily, typically after meals - Stool described as 'little tiny pieces' and 'hard' - Concern about being 'constipated or backed up' - History of Linzess use, discontinued due to concerns about medication strength - Incomplete evacuation with bowel movements - Rare episodes of diarrhea  Abdominal discomfort and pain - Abdominal discomfort present almost daily - Stomach pain most prominent in the morning, improves by 4 PM and remains improved until bedtime - Stomach pain recurs upon waking in the morning - 'Brown pasty smear' after bowel movements, requiring use of a pad throughout the day  Dietary habits and fluid intake - Eats three meals daily with some fruits and vegetables (bananas, grapes, lettuce, tomatoes, other vegetables) - Drinks approximately 54 ounces of water daily, aiming for 64 ounces - Occasional consumption of Coke or Pepsi - Pear and prune juice found helpful for bowel movements - Avoids onions and tomatoes due to potential discomfort  Sleep pattern - Goes to bed shortly after dinner - Wakes around 3 or 4 AM, reads until getting up at 7 AM - Sleeps well through the night - Feels good during the night  Weight status - Weight stable at 120-121 pounds - Feels  comfortable at current weight  Medication use and gastrointestinal symptoms - History of gastritis-like symptoms - Inquires about daily stomach medications - Has Zofran  available but does not use it regularly   - History of ?Myelo fibrosis diagnosed 20 years ago, but not confirmed on subsequent MRI, never had bone marrow biopsy - Liver function tests have been normal in the past - Various tests including CT scan performed without significant findings   Normal pancreatic elastase, 375 on September 18, 2023   CT abdomen pelvis with contrast September 20, 2023 No acute findings or other significant abnormality.    Ultrasound left breast including axilla September 18, 2023 No evidence of malignancy in the left breast.    EGD Jul 09, 2023 - Z- line regular, 36 cm from the incisors. -Mild gastritis . Biopsied [benign fundic gland polyps, negative for H. pylori or any precancerous changes]. - Normal examined duodenum.   Colonoscopy June 12, 2023 - Two 3 to 5 mm polyps in the transverse colon and in the ascending colon [tubular adenomas], removed with a cold snare. Resected and retrieved. - Non- bleeding external and internal hemorrhoids.   Outpatient Encounter Medications as of 02/19/2024  Medication Sig   acyclovir (ZOVIRAX) 400 MG tablet Take 400 mg by mouth every other day. Monday , Wednesday and Friday   amLODipine (NORVASC) 5 MG tablet Take 5 mg by mouth daily.   estradiol (ESTRACE) 0.5 MG tablet Take 0.5 mg by mouth daily.   levocetirizine (XYZAL) 5 MG tablet Take 5 mg by mouth every evening.   nitroGLYCERIN  (NITROSTAT ) 0.4  MG SL tablet Place 0.4 mg under the tongue every 5 (five) minutes as needed for chest pain.   Olopatadine HCl (PATADAY) 0.2 % SOLN as needed.   polyethylene glycol powder (MIRALAX) 17 GM/SCOOP powder Take 17 g by mouth daily. Dissolve 1 capful (17g) in 4-8 ounces of liquid and take by mouth daily.   rosuvastatin  (CRESTOR ) 10 MG tablet Take 1 tablet (10 mg total) by mouth daily.    Wheat Dextrin (BENEFIBER DRINK MIX PO) Take 1 Dose by mouth daily. 1 teaspoon   No facility-administered encounter medications on file as of 02/19/2024.    Allergies as of 02/19/2024 - Review Complete 02/19/2024  Allergen Reaction Noted   Aspirin Anaphylaxis 04/08/2008   Nsaids Anaphylaxis 04/08/2008   Other Hives 08/08/2018   Penicillins Rash 04/08/2008   Sulfonamide derivatives Other (See Comments) 04/08/2008    Past Medical History:  Diagnosis Date   Allergy    SEASONAL   Anxiety    Arthritis    back,hands   Asthma    took shots   Bilateral sensorineural hearing loss 07/14/2019   Breast lump 08/29/2020   Chest pain, unspecified 09/27/2020   Chronic kidney disease    Stage 3/ moderate   Depression    Diabetes mellitus, type II (HCC)    Environmental allergies    Essential hypertension 06/12/2015   Fatigue 06/29/2015   Glaucoma    Hypercholesterolemia    Rectocele    Sleep apnea, obstructive 06/29/2015   Has CPAP    Past Surgical History:  Procedure Laterality Date   ABDOMINAL HYSTERECTOMY  1992   total   COLONOSCOPY     TUBAL LIGATION      Family History  Problem Relation Age of Onset   Cancer Mother        appendix   Stroke Mother    Dementia Mother    Heart disease Father    Heart attack Father    Coronary artery disease Father    Congestive Heart Failure Sister    Diabetes Sister    Congenital heart disease Brother    Stroke Maternal Grandmother    Lymphoma Daughter    Esophageal cancer Neg Hx    Rectal cancer Neg Hx    Stomach cancer Neg Hx    Colon cancer Neg Hx     Social History   Socioeconomic History   Marital status: Married    Spouse name: Jayson   Number of children: 1   Years of education: 18   Highest education level: Not on file  Occupational History   Occupation: retired  Tobacco Use   Smoking status: Former    Current packs/day: 0.00    Types: Cigarettes    Quit date: 03/05/1978    Years since quitting: 45.9    Smokeless tobacco: Never  Vaping Use   Vaping status: Never Used  Substance and Sexual Activity   Alcohol use: Not Currently    Alcohol/week: 2.0 standard drinks of alcohol    Types: 2 Shots of liquor per week    Comment: rarely   Drug use: No   Sexual activity: Not Currently    Birth control/protection: Post-menopausal  Other Topics Concern   Not on file  Social History Narrative   Lives in [place] with husband.    Social Drivers of Health   Tobacco Use: Medium Risk (11/28/2023)   Patient History    Smoking Tobacco Use: Former    Smokeless Tobacco Use: Never    Passive Exposure:  Not on file  Financial Resource Strain: Not on file  Food Insecurity: No Food Insecurity (11/15/2021)   Hunger Vital Sign    Worried About Running Out of Food in the Last Year: Never true    Ran Out of Food in the Last Year: Never true  Transportation Needs: No Transportation Needs (11/15/2021)   PRAPARE - Administrator, Civil Service (Medical): No    Lack of Transportation (Non-Medical): No  Physical Activity: Sufficiently Active (05/04/2021)   Received from [facility]   Exercise Vital Sign    On average, how many days per week do you engage in moderate to strenuous exercise (like a brisk walk)?: 5 days    On average, how many minutes do you engage in exercise at this level?: 30 min  Stress: Not on file  Social Connections: Not on file  Intimate Partner Violence: Not on file  Depression (PHQ2-9): Low Risk (09/05/2023)   Depression (PHQ2-9)    PHQ-2 Score: 1  Alcohol Screen: Not on file  Housing: Low Risk (11/15/2021)   Housing    Last Housing Risk Score: 0  Utilities: Not on file  Health Literacy: Not on file      Review of systems: All other review of systems negative except as mentioned in the HPI.   Physical Exam: Vitals:   02/19/24 1354  BP: 124/76  Pulse: 85   Body mass index is 23.45 kg/m. Gen:      No acute distress HEENT:  sclera anicteric CV: s1s2 rrr,  no murmur Lungs: B/l clear. Abd:      soft, non-tender; no palpable masses, no distension Ext:    No edema Neuro: alert and oriented x 3 Psych: normal mood and affect  Data Reviewed:  Reviewed labs, radiology imaging, old records and pertinent past GI work up   Assessment & Plan Chronic constipation with incomplete evacuation Chronic constipation with incomplete evacuation, characterized by small, hard stools and pasty residue. Current regimen includes Miralax and Benefiber, but she finds it difficult to manage multiple pills daily. Concerns about being backed up, but examination suggests incomplete evacuation rather than significant constipation. - Provided samples of Linzess 72 mcg daily, to be taken on an empty stomach in the morning. - If Linzess is effective, discontinue Miralax. - If Linzess is not effective, consider adding Miralax or increasing Linzess to two capsules daily. - If diarrhea or cramping occurs, discontinue Linzess. - Encouraged increased water intake to 64 ounces daily if tolerated.  Pelvic floor dysfunction Symptoms include pasty residue and need for pads due to incomplete evacuation. Discussed the role of the sphincter in incomplete evacuation and potential benefits of pelvic floor physical therapy. - Referred to pelvic floor physical therapy for exercises and techniques to improve sphincter relaxation. - Use a step stool to aid in evacuation.  Chronic gastritis and dyspepsia Chronic gastritis and dyspepsia with daily stomach discomfort. Symptoms improve after sleep, suggesting possible nocturnal acid reflux. Discussed the use of Pepcid  for symptom management and the potential impact of dietary choices on symptoms. - Use Pepcid  20 mg up to twice daily as needed for symptom relief. - Avoid lying down immediately after meals; wait at least 2-3 hours before lying down. - Consider dietary modifications to reduce symptoms, such as avoiding soda if it exacerbates  symptoms.  Gastroesophageal reflux disease Symptoms suggestive of GERD, with improvement in symptoms after sleep. Discussed the potential for nocturnal acid reflux and the importance of timing meals and sleep to manage  symptoms. - Avoid lying down within 2-3 hours after eating to reduce reflux. - Use Pepcid  20mg  before breakfast and dinner to manage symptoms.        This visit required 30 minutes of patient care (this includes precharting, chart review, review of results, face-to-face time used for counseling as well as treatment plan and follow-up. The patient was provided an opportunity to ask questions and all were answered. The patient agreed with the plan and demonstrated an understanding of the instructions.  LOIS Wilkie Mcgee , MD    CC: Hamrick, Maura L, MD

## 2024-02-21 ENCOUNTER — Ambulatory Visit: Admitting: Cardiology

## 2024-03-10 ENCOUNTER — Telehealth: Payer: Self-pay | Admitting: Gastroenterology

## 2024-03-10 NOTE — Telephone Encounter (Signed)
 Patient called stating she would like to discuss progress of taking linzess . States she has been having diarrhea but it is not to bad. States she is unsure if she should have prescription. Also stated some information was incorrect in AVS. States she had soft small stools not hard stools. Also states she had discomfort from GERD during the night and not at night. Please advise, thank you

## 2024-03-11 ENCOUNTER — Other Ambulatory Visit: Payer: Self-pay

## 2024-03-11 MED ORDER — LINACLOTIDE 72 MCG PO CAPS: 72.0000 ug | ORAL_CAPSULE | Freq: Every day | ORAL | 5 refills | Status: DC

## 2024-03-11 MED ORDER — FAMOTIDINE 20 MG PO TABS: 20.0000 mg | ORAL_TABLET | Freq: Two times a day (BID) | ORAL | 5 refills | Status: AC

## 2024-03-11 NOTE — Telephone Encounter (Signed)
 Please send prescription for Linzess  72 mcg daily, Rx for 90 days with 3 refills.  Thanks

## 2024-03-11 NOTE — Telephone Encounter (Signed)
 Patient reports loose stool or diarrhea 2 to 4 times in the mornings after she eats her breakfast. No further bowel movements through the day. She is satisfied with Linzess  72 mcg and would like to continue if covered by her insurance. She is not taking Miralax or Benefiber.

## 2024-03-12 ENCOUNTER — Other Ambulatory Visit: Payer: Self-pay

## 2024-03-12 MED ORDER — LINACLOTIDE 72 MCG PO CAPS: 72.0000 ug | ORAL_CAPSULE | Freq: Every day | ORAL | Status: AC

## 2024-03-12 NOTE — Telephone Encounter (Signed)
 Pharmacy notified.

## 2024-03-18 ENCOUNTER — Encounter: Payer: Self-pay | Admitting: Cardiology

## 2024-03-18 ENCOUNTER — Ambulatory Visit: Attending: Cardiology | Admitting: Cardiology

## 2024-03-18 VITALS — BP 150/80 | HR 68 | Ht 61.0 in | Wt 125.2 lb

## 2024-03-18 DIAGNOSIS — E559 Vitamin D deficiency, unspecified: Secondary | ICD-10-CM | POA: Diagnosis not present

## 2024-03-18 DIAGNOSIS — R5383 Other fatigue: Secondary | ICD-10-CM | POA: Diagnosis present

## 2024-03-18 DIAGNOSIS — I119 Hypertensive heart disease without heart failure: Secondary | ICD-10-CM | POA: Insufficient documentation

## 2024-03-18 DIAGNOSIS — E782 Mixed hyperlipidemia: Secondary | ICD-10-CM | POA: Diagnosis not present

## 2024-03-18 DIAGNOSIS — R400 Somnolence: Secondary | ICD-10-CM | POA: Insufficient documentation

## 2024-03-18 DIAGNOSIS — R002 Palpitations: Secondary | ICD-10-CM | POA: Diagnosis present

## 2024-03-18 DIAGNOSIS — I4729 Other ventricular tachycardia: Secondary | ICD-10-CM | POA: Diagnosis present

## 2024-03-18 DIAGNOSIS — R0683 Snoring: Secondary | ICD-10-CM | POA: Insufficient documentation

## 2024-03-18 MED ORDER — METOPROLOL TARTRATE 100 MG PO TABS: ORAL_TABLET | ORAL | 0 refills | Status: AC

## 2024-03-18 MED ORDER — AMLODIPINE BESYLATE 10 MG PO TABS: 10.0000 mg | ORAL_TABLET | Freq: Every day | ORAL | 3 refills | Status: AC

## 2024-03-18 NOTE — Progress Notes (Signed)
 " Cardiology Office Note:    Date:  03/18/2024   ID:  Holly Nixon, DOB 19-Aug-1945, MRN [id number]  PCP:  Stephanie Charlene LITTIE, MD  Cardiologist:  Dub Huntsman, DO  Electrophysiologist:  None   Referring MD: Stephanie Charlene LITTIE, MD    I am doing ok  History of Present Illness:    Holly Nixon is a 79 y.o. female with a hx of hypertension, hypercholesteremia, hyperlipidemia, sleep apnea, depression, CKD stage III, type 2 diabetes here today for follow-up visit.  Discussed the use of AI scribe software for clinical note transcription with the patient, who gave verbal consent to proceed Her last visit in our office was in July 2025 she did see Josefa Beauvais, NP.  During her time previously was atypical chest pain her echocardiogram was reassuring 30-day monitor was placed on the patient.  To discuss her fatigue it has been noted and she has had extensive workup.  Her blood pressure was at target.  Today she tells me not much has changed.  She has had severe exhaustion since May 18, 2023, that limits daily activities despite attempts to stay active.  She had an unintentional 30-pound weight loss that began with loss of appetite, with partial regain of about 12 to 13 pounds as appetite returned. She also has persistent nausea, postprandial stomach aches, and night sweats once or twice nightly.  She reports extensive prior testing with blood work and multiple imaging and endoscopic studies that have reportedly been normal, except for elevated ferritin felt to be hereditary.  She has chronic stomach pain, improved with Pepcid .  Her home blood pressure readings are slightly elevated, typically in the 130s to 140s, with some fluctuation on daily monitoring.  She previously used CPAP for sleep apnea but was told she no longer needed it after a sleep study about 20 years ago. She now has nightmares and prominent movements during sleep.  She is concerned about this.  Past Medical History:   Diagnosis Date   Allergy    SEASONAL   Anxiety    Arthritis    back,hands   Asthma    took shots   Bilateral sensorineural hearing loss 07/14/2019   Breast lump 08/29/2020   Chest pain, unspecified 09/27/2020   Chronic kidney disease    Stage 3/ moderate   Depression    Diabetes mellitus, type II (HCC)    Environmental allergies    Essential hypertension 06/12/2015   Fatigue 06/29/2015   Glaucoma    Hypercholesterolemia    Rectocele    Sleep apnea, obstructive 06/29/2015   Has CPAP    Past Surgical History:  Procedure Laterality Date   ABDOMINAL HYSTERECTOMY  1992   total   COLONOSCOPY     TUBAL LIGATION      Current Medications: Current Meds  Medication Sig   acyclovir (ZOVIRAX) 400 MG tablet Take 400 mg by mouth every other day. Monday , Wednesday and Friday   amLODipine  (NORVASC ) 10 MG tablet Take 1 tablet (10 mg total) by mouth daily.   estradiol (ESTRACE) 0.5 MG tablet Take 0.5 mg by mouth daily.   famotidine  (PEPCID ) 20 MG tablet Take 1 tablet (20 mg total) by mouth 2 (two) times daily.   levocetirizine (XYZAL) 5 MG tablet Take 5 mg by mouth every evening.   linaclotide  (LINZESS ) 72 MCG capsule Take 1 capsule (72 mcg total) by mouth daily before breakfast.   metoprolol  tartrate (LOPRESSOR ) 100 MG tablet Take 2 hours prior to CT  nitroGLYCERIN  (NITROSTAT ) 0.4 MG SL tablet Place 0.4 mg under the tongue every 5 (five) minutes as needed for chest pain.   Olopatadine HCl (PATADAY) 0.2 % SOLN as needed.   polyethylene glycol powder (MIRALAX) 17 GM/SCOOP powder Take 17 g by mouth daily. Dissolve 1 capful (17g) in 4-8 ounces of liquid and take by mouth daily.   rosuvastatin  (CRESTOR ) 10 MG tablet Take 1 tablet (10 mg total) by mouth daily.   Wheat Dextrin (BENEFIBER DRINK MIX PO) Take 1 Dose by mouth daily. 1 teaspoon   [DISCONTINUED] amLODipine  (NORVASC ) 5 MG tablet Take 5 mg by mouth daily.     Allergies:   Aspirin, Nsaids, Other, Penicillins, and Sulfonamide  derivatives   Social History   Socioeconomic History   Marital status: Married    Spouse name: Jayson   Number of children: 1   Years of education: 18   Highest education level: Not on file  Occupational History   Occupation: retired  Tobacco Use   Smoking status: Former    Current packs/day: 0.00    Types: Cigarettes    Quit date: 03/05/1978    Years since quitting: 46.0   Smokeless tobacco: Never  Vaping Use   Vaping status: Never Used  Substance and Sexual Activity   Alcohol use: Not Currently    Alcohol/week: 2.0 standard drinks of alcohol    Types: 2 Shots of liquor per week    Comment: rarely   Drug use: No   Sexual activity: Not Currently    Birth control/protection: Post-menopausal  Other Topics Concern   Not on file  Social History Narrative   Lives in [place] with husband.    Social Drivers of Health   Tobacco Use: Medium Risk (03/18/2024)   Patient History    Smoking Tobacco Use: Former    Smokeless Tobacco Use: Never    Passive Exposure: Not on file  Financial Resource Strain: Not on file  Food Insecurity: No Food Insecurity (11/15/2021)   Hunger Vital Sign    Worried About Running Out of Food in the Last Year: Never true    Ran Out of Food in the Last Year: Never true  Transportation Needs: No Transportation Needs (11/15/2021)   PRAPARE - Administrator, Civil Service (Medical): No    Lack of Transportation (Non-Medical): No  Physical Activity: Sufficiently Active (05/04/2021)   Received [facility]   Exercise Vital Sign    On average, how many days per week do you engage in moderate to strenuous exercise (like a brisk walk)?: 5 days    On average, how many minutes do you engage in exercise at this level?: 30 min  Stress: Not on file  Social Connections: Not on file  Depression (PHQ2-9): Low Risk (09/05/2023)   Depression (PHQ2-9)    PHQ-2 Score: 1  Alcohol Screen: Not on file  Housing: Low Risk (11/15/2021)   Housing    Last  Housing Risk Score: 0  Utilities: Not on file  Health Literacy: Not on file     Family History: The patient's family history includes Cancer in her mother; Congenital heart disease in her brother; Congestive Heart Failure in her sister; Coronary artery disease in her father; Dementia in her mother; Diabetes in her sister; Heart attack in her father; Heart disease in her father; Lymphoma in her daughter; Stroke in her maternal grandmother and mother. There is no history of Esophageal cancer, Rectal cancer, Stomach cancer, or Colon cancer.  ROS:  Review of Systems  Constitution: Negative for decreased appetite, fever and weight gain.  HENT: Negative for congestion, ear discharge, hoarse voice and sore throat.   Eyes: Negative for discharge, redness, vision loss in right eye and visual halos.  Cardiovascular: Negative for chest pain, dyspnea on exertion, leg swelling, orthopnea and palpitations.  Respiratory: Negative for cough, hemoptysis, shortness of breath and snoring.   Endocrine: Negative for heat intolerance and polyphagia.  Hematologic/Lymphatic: Negative for bleeding problem. Does not bruise/bleed easily.  Skin: Negative for flushing, nail changes, rash and suspicious lesions.  Musculoskeletal: Negative for arthritis, joint pain, muscle cramps, myalgias, neck pain and stiffness.  Gastrointestinal: Negative for abdominal pain, bowel incontinence, diarrhea and excessive appetite.  Genitourinary: Negative for decreased libido, genital sores and incomplete emptying.  Neurological: Negative for brief paralysis, focal weakness, headaches and loss of balance.  Psychiatric/Behavioral: Negative for altered mental status, depression and suicidal ideas.  Allergic/Immunologic: Negative for HIV exposure and persistent infections.    EKGs/Labs/Other Studies Reviewed:    The following studies were reviewed today:   EKG:  The ekg ordered today demonstrates sinus rhythm 60 bpm  Nuclear  stress  test 9//2022   The study is normal. The study is low risk.   Left ventricular function is normal. Nuclear stress EF: 68 %. The left ventricular ejection fraction is hyperdynamic (>65%).   Prior study not available for comparison.   TTE 9/1/2022IMPRESSIONS   1. Left ventricular ejection fraction, by estimation, is 50 to 55%. The  left ventricle has low normal function. The left ventricle has no regional  wall motion abnormalities. Left ventricular diastolic parameters are  consistent with Grade I diastolic  dysfunction (impaired relaxation). The average left ventricular global  longitudinal strain is -9.7 %. The global longitudinal strain is abnormal.   2. Right ventricular systolic function is normal. The right ventricular  size is normal. There is normal pulmonary artery systolic pressure.   3. The mitral valve is normal in structure. No evidence of mitral valve  regurgitation. No evidence of mitral stenosis.   4. The aortic valve is normal in structure. Aortic valve regurgitation is  not visualized. No aortic stenosis is present.   5. The inferior vena cava is normal in size with greater than 50%  respiratory variability, suggesting right atrial pressure of 3 mmHg.   Comparison(s): No prior Echocardiogram.   FINDINGS   Left Ventricle: Left ventricular ejection fraction, by estimation, is 50  to 55%. The left ventricle has low normal function. The left ventricle has  no regional wall motion abnormalities. The average left ventricular global  longitudinal strain is -9.7 %.   The global longitudinal strain is abnormal. The left ventricular internal  cavity size was normal in size. There is no left ventricular hypertrophy.  Left ventricular diastolic parameters are consistent with Grade I  diastolic dysfunction (impaired relaxation).   Right Ventricle: The right ventricular size is normal. No increase in  right ventricular wall thickness. Right ventricular systolic function is   normal. There is normal pulmonary artery systolic pressure. The tricuspid  regurgitant velocity is 2.34 m/s, and   with an assumed right atrial pressure of 3 mmHg, the estimated right  ventricular systolic pressure is 24.9 mmHg.   Left Atrium: Left atrial size was normal in size.   Right Atrium: Right atrial size was normal in size.   Pericardium: There is no evidence of pericardial effusion.   Mitral Valve: The mitral valve is normal in structure. No evidence of  mitral valve regurgitation. No evidence of mitral valve stenosis.   Tricuspid Valve: The tricuspid valve is normal in structure. Tricuspid  valve regurgitation is not demonstrated. No evidence of tricuspid  stenosis.   Aortic Valve: The aortic valve is normal in structure. Aortic valve  regurgitation is not visualized. No aortic stenosis is present.   Pulmonic Valve: The pulmonic valve was normal in structure. Pulmonic valve  regurgitation is not visualized. No evidence of pulmonic stenosis.   Aorta: The aortic root is normal in size and structure.   Venous: The inferior vena cava is normal in size with greater than 50%  respiratory variability, suggesting right atrial pressure of 3 mmHg.   IAS/Shunts: No atrial level shunt detected by color flow Doppler.   Recent Labs: 07/10/2023: Hemoglobin 14.1; Magnesium 2.4; Platelets 233 09/16/2023: ALT 15; BUN 20; Creatinine, Ser 1.07; Potassium 4.0; Sodium 138  Recent Lipid Panel No results found for: CHOL, TRIG, HDL, CHOLHDL, VLDL, LDLCALC, LDLDIRECT  Physical Exam:    VS:  BP (!) 150/80 (BP Location: Right Arm, Patient Position: Sitting, Cuff Size: Normal)   Pulse 68   Ht 5' 1 (1.549 m)   Wt 125 lb 3.2 oz (56.8 kg)   SpO2 99%   BMI 23.66 kg/m     Wt Readings from Last 3 Encounters:  03/18/24 125 lb 3.2 oz (56.8 kg)  02/19/24 124 lb 2 oz (56.3 kg)  11/27/23 117 lb 4 oz (53.2 kg)     GEN: Well nourished, well developed in no acute distress HEENT:  Normal NECK: No JVD; No carotid bruits LYMPHATICS: No lymphadenopathy CARDIAC: S1S2 noted,RRR, no murmurs, rubs, gallops RESPIRATORY:  Clear to auscultation without rales, wheezing or rhonchi  ABDOMEN: Soft, non-tender, non-distended, +bowel sounds, no guarding. EXTREMITIES: No edema, No cyanosis, no clubbing MUSCULOSKELETAL:  No deformity  SKIN: Warm and dry NEUROLOGIC:  Alert and oriented x 3, non-focal PSYCHIATRIC:  Normal affect, good insight  ASSESSMENT:    1. Palpitations   2. NSVT (nonsustained ventricular tachycardia) (HCC)   3. Fatigue, unspecified type   4. Snoring   5. Daytime somnolence   6. Vitamin D  deficiency   7. Hypertensive heart disease without heart failure   8. Mixed hyperlipidemia    PLAN:     Persistent fatigue with intermittent dyspnea and nausea.  I am concerned that this could be her atypical anginal symptoms and with this I would like to proceed with an ischemic evaluation in this patient with a coronary CTA given her risk factors.  She is agreeable for this. In the meantime we will get blood work to assess CMP, BMP, vitamin D  and B12   Recent monitor in June 2024 showed NSVT no significant palpitations for right now we will not add any additional medication.  Hypertension-blood pressure is slightly elevated we will increase her amlodipine  to 10 mg daily.  Sleep apnea, was taken off of CPAP in the past I think she really does need to get reevaluated we will set up the patient for a itamar sleep study she may need to be placed back on her CPAP.  Hyperlipidemia - continue with current statin medication.  The patient is in agreement with the above plan. The patient left the office in stable condition.  The patient will follow up in 12 weeks   Medication Adjustments/Labs and Tests Ordered: Current medicines are reviewed at length with the patient today.  Concerns regarding medicines are outlined above.  Orders Placed This Encounter  Procedures   CT  CORONARY MORPH W/CTA COR W/SCORE W/CA W/CM &/OR WO/CM   VITAMIN D  25 Hydroxy (Vit-D Deficiency, Fractures)   Vitamin B12   Comp Met (CMET)   Magnesium   CBC   TSH+T4F+T3Free   EKG 12-Lead   [facility] Sleep Study   Meds ordered this encounter  Medications   metoprolol  tartrate (LOPRESSOR ) 100 MG tablet    Sig: Take 2 hours prior to CT    Dispense:  1 tablet    Refill:  0   amLODipine  (NORVASC ) 10 MG tablet    Sig: Take 1 tablet (10 mg total) by mouth daily.    Dispense:  180 tablet    Refill:  3    Patient Instructions  Medication Instructions:  Your physician has recommended you make the following change in your medication:  INCREASE: Amlodipine  10 mg once daily  *If you need a refill on your cardiac medications before your next appointment, please call your pharmacy*  Lab Work: CMET, Mag, CBC, TSH, Vit D, Vit b12 If you have labs (blood work) drawn today and your tests are completely normal, you will receive your results only by: MyChart Message (if you have MyChart) OR A paper copy in the mail If you have any lab test that is abnormal or we need to change your treatment, we will call you to review the results.  Testing/Procedures:   Your cardiac CT will be scheduled at one of the below locations:   [facility] [address]  [place], [place] [address]  If scheduled at [facility], please arrive at the [facility] and Children's Entrance (Entrance C2) of [facility] 30 minutes prior to test start time. You can use the FREE valet parking offered at entrance C (encouraged to control the heart rate for the test)  Proceed to the [facility] Radiology Department (first floor) to check-in and test prep.  All radiology patients and guests should use entrance C2 at [facility], accessed from [facility], even though the hospital's physical address listed is [address]  If scheduled at the Heart and  Vascular Tower at [address], please enter the parking lot using the [address] entrance and use the FREE valet service at the patient drop-off area. Enter the building and check-in with registration on the main floor.  If scheduled at [facility], please arrive to the Heart and Vascular Center 15 mins early for check-in and test prep.  There is spacious parking and easy access to the radiology department from the [facility] entrance. Please enter here and check-in with the desk attendant.   If scheduled at [facility], please arrive 30 minutes early for check-in and test prep.  Please follow these instructions carefully (unless otherwise directed):  An IV will be required for this test and Nitroglycerin  will be given.   On the Night Before the Test: Be sure to Drink plenty of water. Do not consume any caffeinated/decaffeinated beverages or chocolate 12 hours prior to your test. Do not take any antihistamines 12 hours prior to your test.   On the Day of the Test: Drink plenty of water until 1 hour prior to the test. Do not eat any food 1 hour prior to test. You may take your regular medications prior to the test.  Take metoprolol  (Lopressor ) two hours prior to test. If you take Furosemide/Hydrochlorothiazide /Spironolactone/Chlorthalidone, please HOLD on the morning of the test. Patients who wear a  continuous glucose monitor MUST remove the device prior to scanning. FEMALES- please wear underwire-free bra if available, avoid dresses & tight clothing      After the Test: Drink plenty of water. After receiving IV contrast, you may experience a mild flushed feeling. This is normal. On occasion, you may experience a mild rash up to 24 hours after the test. This is not dangerous. If this occurs, you can take Benadryl 25 mg, Zyrtec, Claritin, or Allegra and increase your fluid intake. (Patients taking Tikosyn should avoid Benadryl, and may  take Zyrtec, Claritin, or Allegra) If you experience trouble breathing, this can be serious. If it is severe call 911 IMMEDIATELY. If it is mild, please call our office.  We will call to schedule your test 2-4 weeks out understanding that some insurance companies will need an authorization prior to the service being performed.   For more information and frequently asked questions, please visit our website : [email or web address]  For non-scheduling related questions, please contact the cardiac imaging nurse navigator should you have any questions/concerns: Cardiac Imaging Nurse Navigators Direct Office Dial: [phone]   For scheduling needs, including cancellations and rescheduling, please call Brittany, [phone].  WatchPAT? is an FDA-cleared portable home sleep study test that uses a watch and 3 points of contact to monitor 7 different channels, including your heart rate, oxygen saturation, body position, snoring, and chest motion.  The study is easy to use from the comfort of your own home and accurately detect sleep apnea.  Before bed, you attach the chest sensor, attached the sleep apnea bracelet to your nondominant hand, and attach the finger probe.  After the study, the raw data is downloaded from the watch and scored for apnea events.   For more information: [email or web address]  Patient Testing Instructions:  Once our office has received insurance approval for you to complete this test, we will contact you with a PIN to activate the device.  This typically takes 2-3 weeks.  Please do not open the box until approved.   Do not put battery into the device until bedtime when you are ready to begin the test. Please call the support number if you need assistance after following the instructions below: 24 hour support line- [phone] or [facility] support at [phone] (option 2)  Download the [facility] WatchPAT One app through the [employer] or  [employer]. Be sure to turn on or enable access to Bluetooth in settings on your smartphone. Make sure no other Bluetooth devices are on and within the vicinity of your smartphone and WatchPAT watch during testing.  Make sure to leave your smart phone plugged in and charging all night.  When ready for bed:  Follow the instructions step by step in the WatchPAT One app to activate the testing device. For additional instructions, including video instruction, visit the WatchPAT One video on Youtube. You can search for WatchPAT One within Youtube (video is 4 minutes and 18 seconds) or enter: https://youtube/[email or web address]?[email or web address]_vbiwxE Please note: You will be prompted to enter a PIN to connect via Bluetooth when starting the test. The PIN will be assigned to you after insurance has approved the test.  The device is disposable, but it recommended that you retain the device until you receive a call letting you know the study has been received and the results have been interpreted.  We will let you know if the study did not transmit to us  properly after the test is completed. You do not need to call us   to confirm the receipt of the test.  Please complete the test within 48 hours of receiving PIN.   Frequently Asked Questions:  What is Watch PAT One?  A single use, fully disposable home sleep apnea testing device and will not need to be returned after completion.  What are the requirements to use WatchPAT One?  A successful WatchPAT One sleep study requires a WatchPAT One device, your smart phone, WatchPAT One app, your PIN number, and internet access. What type of phone do I need?  You should have a smart phone that uses Android 5.1 and above or any iPhone with IOS 10 and above. How can I download the WatchPAT one app?  Based on your device type search for WatchPAT One app either in [employer] for [employer] or [employer] for [employer]. Where will I get my PIN for the study?  Your PIN will  be provided by your physician's office after insurance has approved the test. This process typically takes 2-3 weeks. It is used for authentication and if you lose/forget your PIN, please reach out to your provider's office.  I do not have internet at home. Can I still complete a WatchPAT One study?  WatchPAT One needs internet connection throughout the night to be able to transmit the sleep data. You can use your home/local internet or your cellular data package. However, it is always recommended to use home/local internet. It is estimated that between 20MB-30MB of data will be used with each study, but the application will be looking for space in the phone to start the study.  What happens if I lose internet or Bluetooth connection?  During the internet disconnection, your phone will not be able to transmit the sleep data.  All the data, will be stored in your phone.  As soon as the internet connection is back on, the phone will resume sending the sleep data. During the Bluetooth disconnection, WatchPAT One will not be able to to send the sleep data to your phone.  Data will be kept in the WatchPAT One until both devices have Bluetooth connection back on.  As soon as the connection is back on, WatchPAT one will send the sleep data to the phone.  How long do I need to wear the WatchPAT one?  After you start the study, you should wear the device at least 6 hours.  How far should I keep my phone from the device?  During the night, your phone should remain within 15 feet of where you sleep.  What happens if I leave the room for restroom or other reasons?  Leaving the room for any reason will not cause any problem. As soon as your get back to the room, both devices will reconnect and will continue to send the sleep data. Can I use my phone during the sleep study?  Yes, you can use your phone as usual during the study. But it is recommended to put your WatchPAT One on when you are ready to go to bed.   How will I get my study results?  A soon as you completed your study, your sleep data will be sent to the provider. They will then share the results with you when they are ready.   Follow-Up: At [facility], you and your health needs are our priority.  As part of our continuing mission to provide you with exceptional heart care, our providers are all part of one team.  This team  includes your primary Cardiologist (physician) and Advanced Practice Providers or APPs (Physician Assistants and Nurse Practitioners) who all work together to provide you with the care you need, when you need it.  Your next appointment:   16 week(s)  Provider:   Arla Boutwell, DO     Other Instructions:            Adopting a Healthy Lifestyle.  Know what a healthy weight is for you (roughly BMI <25) and aim to maintain this   Aim for 7+ servings of fruits and vegetables daily   65-80+ fluid ounces of water or unsweet tea for healthy kidneys   Limit to max 1 drink of alcohol per day; avoid smoking/tobacco   Limit animal fats in diet for cholesterol and heart health - choose grass fed whenever available   Avoid highly processed foods, and foods high in saturated/trans fats   Aim for low stress - take time to unwind and care for your mental health   Aim for 150 min of moderate intensity exercise weekly for heart health, and weights twice weekly for bone health   Aim for 7-9 hours of sleep daily   When it comes to diets, agreement about the perfect plan isnt easy to find, even among the experts. Experts at the [facility] [employer] developed an idea known as the Healthy Eating Plate. Just imagine a plate divided into logical, healthy portions.   The emphasis is on diet quality:   Load up on vegetables and fruits - one-half of your plate: Aim for color and variety, and remember that potatoes dont count.   Go for whole grains - one-quarter of your plate: Whole wheat, barley,  wheat berries, quinoa, oats, brown rice, and foods made with them. If you want pasta, go with whole wheat pasta.   Protein power - one-quarter of your plate: Fish, chicken, beans, and nuts are all healthy, versatile protein sources. Limit red meat.   The diet, however, does go beyond the plate, offering a few other suggestions.   Use healthy plant oils, such as olive, canola, soy, corn, sunflower and peanut. Check the labels, and avoid partially hydrogenated oil, which have unhealthy trans fats.   If youre thirsty, drink water. Coffee and tea are good in moderation, but skip sugary drinks and limit milk and dairy products to one or two daily servings.   The type of carbohydrate in the diet is more important than the amount. Some sources of carbohydrates, such as vegetables, fruits, whole grains, and beans-are healthier than others.   Finally, stay active  Signed, Elim Economou, DO  03/18/2024 1:56 PM    [facility] "

## 2024-03-18 NOTE — Patient Instructions (Addendum)
 Medication Instructions:  Your physician has recommended you make the following change in your medication:  INCREASE: Amlodipine  10 mg once daily  *If you need a refill on your cardiac medications before your next appointment, please call your pharmacy*  Lab Work: CMET, Mag, CBC, TSH, Vit D, Vit b12 If you have labs (blood work) drawn today and your tests are completely normal, you will receive your results only by: MyChart Message (if you have MyChart) OR A paper copy in the mail If you have any lab test that is abnormal or we need to change your treatment, we will call you to review the results.  Testing/Procedures:   Your cardiac CT will be scheduled at one of the below locations:   [facility] [address]  [place], [place] [address]  If scheduled at [facility], please arrive at the [facility] and Children's Entrance (Entrance C2) of [facility] 30 minutes prior to test start time. You can use the FREE valet parking offered at entrance C (encouraged to control the heart rate for the test)  Proceed to the [facility] Radiology Department (first floor) to check-in and test prep.  All radiology patients and guests should use entrance C2 at [facility], accessed from [facility], even though the hospital's physical address listed is [address]  If scheduled at the Heart and Vascular Tower at [address], please enter the parking lot using the [address] entrance and use the FREE valet service at the patient drop-off area. Enter the building and check-in with registration on the main floor.  If scheduled at [facility], please arrive to the Heart and Vascular Center 15 mins early for check-in and test prep.  There is spacious parking and easy access to the radiology department from the [facility] entrance. Please enter here and check-in with the desk attendant.   If  scheduled at [facility], please arrive 30 minutes early for check-in and test prep.  Please follow these instructions carefully (unless otherwise directed):  An IV will be required for this test and Nitroglycerin  will be given.   On the Night Before the Test: Be sure to Drink plenty of water. Do not consume any caffeinated/decaffeinated beverages or chocolate 12 hours prior to your test. Do not take any antihistamines 12 hours prior to your test.   On the Day of the Test: Drink plenty of water until 1 hour prior to the test. Do not eat any food 1 hour prior to test. You may take your regular medications prior to the test.  Take metoprolol  (Lopressor ) two hours prior to test. If you take Furosemide/Hydrochlorothiazide /Spironolactone/Chlorthalidone, please HOLD on the morning of the test. Patients who wear a continuous glucose monitor MUST remove the device prior to scanning. FEMALES- please wear underwire-free bra if available, avoid dresses & tight clothing      After the Test: Drink plenty of water. After receiving IV contrast, you may experience a mild flushed feeling. This is normal. On occasion, you may experience a mild rash up to 24 hours after the test. This is not dangerous. If this occurs, you can take Benadryl 25 mg, Zyrtec, Claritin, or Allegra and increase your fluid intake. (Patients taking Tikosyn should avoid Benadryl, and may take Zyrtec, Claritin, or Allegra) If you experience trouble breathing, this can be serious. If it is severe call 911 IMMEDIATELY. If it is mild, please call our office.  We will call to schedule your test 2-4 weeks out understanding that some insurance companies will need an authorization prior to the service being performed.   For more information and frequently asked questions, please visit our website : [email or web address]  For non-scheduling related questions, please contact the cardiac imaging nurse navigator should  you have any questions/concerns: Cardiac Imaging Nurse Navigators Direct Office Dial: [phone]   For scheduling needs, including cancellations and rescheduling, please call Brittany, [phone].  WatchPAT? is an FDA-cleared portable home sleep study test that uses a watch and 3 points of contact to monitor 7 different channels, including your heart rate, oxygen saturation, body position, snoring, and chest motion.  The study is easy to use from the comfort of your own home and accurately detect sleep apnea.  Before bed, you attach the chest sensor, attached the sleep apnea bracelet to your nondominant hand, and attach the finger probe.  After the study, the raw data is downloaded from the watch and scored for apnea events.   For more information: [email or web address]  Patient Testing Instructions:  Once our office has received insurance approval for you to complete this test, we will contact you with a PIN to activate the device.  This typically takes 2-3 weeks.  Please do not open the box until approved.   Do not put battery into the device until bedtime when you are ready to begin the test. Please call the support number if you need assistance after following the instructions below: 24 hour support line- [phone] or [facility] support at [phone] (option 2)  Download the [facility] WatchPAT One app through the [employer] or [employer]. Be sure to turn on or enable access to Bluetooth in settings on your smartphone. Make sure no other Bluetooth devices are on and within the vicinity of your smartphone and WatchPAT watch during testing.  Make sure to leave your smart phone plugged in and charging all night.  When ready for bed:  Follow the instructions step by step in the WatchPAT One app to activate the testing device. For additional instructions, including video instruction, visit the WatchPAT One video on Youtube. You can search for WatchPAT One within  Youtube (video is 4 minutes and 18 seconds) or enter: https://youtube/[email or web address] Please note: You will be prompted to enter a PIN to connect via Bluetooth when starting the test. The PIN will be assigned to you after insurance has approved the test.  The device is disposable, but it recommended that you retain the device until you receive a call letting you know the study has been received and the results have been interpreted.  We will let you know if the study did not transmit to us  properly after the test is completed. You do not need to call us  to confirm the receipt of the test.  Please complete the test within 48 hours of receiving PIN.   Frequently Asked Questions:  What is Watch PAT One?  A single use, fully disposable home sleep apnea testing device and will not need to be returned after completion.  What are the requirements to use WatchPAT One?  A successful WatchPAT One sleep study requires a WatchPAT One device, your smart phone, WatchPAT One app, your PIN number, and internet access. What type of phone do I need?  You should have a smart phone that uses Android 5.1 and above or any iPhone with IOS 10 and above. How can I download the WatchPAT one app?  Based on your device  type search for WatchPAT One app either in [employer] for [employer] or [employer] for [employer]. Where will I get my PIN for the study?  Your PIN will be provided by your physician's office after insurance has approved the test. This process typically takes 2-3 weeks. It is used for authentication and if you lose/forget your PIN, please reach out to your provider's office.  I do not have internet at home. Can I still complete a WatchPAT One study?  WatchPAT One needs internet connection throughout the night to be able to transmit the sleep data. You can use your home/local internet or your cellular data package. However, it is always recommended to use home/local internet. It is estimated  that between 20MB-30MB of data will be used with each study, but the application will be looking for space in the phone to start the study.  What happens if I lose internet or Bluetooth connection?  During the internet disconnection, your phone will not be able to transmit the sleep data.  All the data, will be stored in your phone.  As soon as the internet connection is back on, the phone will resume sending the sleep data. During the Bluetooth disconnection, WatchPAT One will not be able to to send the sleep data to your phone.  Data will be kept in the WatchPAT One until both devices have Bluetooth connection back on.  As soon as the connection is back on, WatchPAT one will send the sleep data to the phone.  How long do I need to wear the WatchPAT one?  After you start the study, you should wear the device at least 6 hours.  How far should I keep my phone from the device?  During the night, your phone should remain within 15 feet of where you sleep.  What happens if I leave the room for restroom or other reasons?  Leaving the room for any reason will not cause any problem. As soon as your get back to the room, both devices will reconnect and will continue to send the sleep data. Can I use my phone during the sleep study?  Yes, you can use your phone as usual during the study. But it is recommended to put your WatchPAT One on when you are ready to go to bed.  How will I get my study results?  A soon as you completed your study, your sleep data will be sent to the provider. They will then share the results with you when they are ready.   Follow-Up: At [facility], you and your health needs are our priority.  As part of our continuing mission to provide you with exceptional heart care, our providers are all part of one team.  This team includes your primary Cardiologist (physician) and Advanced Practice Providers or APPs (Physician Assistants and Nurse Practitioners) who all work  together to provide you with the care you need, when you need it.  Your next appointment:   16 week(s)  Provider:   Kardie Tobb, DO     Other Instructions:

## 2024-03-19 LAB — COMPREHENSIVE METABOLIC PANEL WITH GFR
ALT: 14 IU/L (ref 0–32)
AST: 16 IU/L (ref 0–40)
Albumin: 4.6 g/dL (ref 3.8–4.8)
Alkaline Phosphatase: 69 IU/L (ref 49–135)
BUN/Creatinine Ratio: 17 (ref 12–28)
BUN: 15 mg/dL (ref 8–27)
Bilirubin Total: 0.5 mg/dL (ref 0.0–1.2)
CO2: 21 mmol/L (ref 20–29)
Calcium: 9.7 mg/dL (ref 8.7–10.3)
Chloride: 103 mmol/L (ref 96–106)
Creatinine, Ser: 0.86 mg/dL (ref 0.57–1.00)
Globulin, Total: 2 g/dL (ref 1.5–4.5)
Glucose: 92 mg/dL (ref 70–99)
Potassium: 4.4 mmol/L (ref 3.5–5.2)
Sodium: 139 mmol/L (ref 134–144)
Total Protein: 6.6 g/dL (ref 6.0–8.5)
eGFR: 69 mL/min/1.73

## 2024-03-19 LAB — CBC
Hematocrit: 40.6 % (ref 34.0–46.6)
Hemoglobin: 13.7 g/dL (ref 11.1–15.9)
MCH: 31.3 pg (ref 26.6–33.0)
MCHC: 33.7 g/dL (ref 31.5–35.7)
MCV: 93 fL (ref 79–97)
Platelets: 217 x10E3/uL (ref 150–450)
RBC: 4.38 x10E6/uL (ref 3.77–5.28)
RDW: 13 % (ref 11.7–15.4)
WBC: 4.9 x10E3/uL (ref 3.4–10.8)

## 2024-03-19 LAB — TSH+T4F+T3FREE
Free T4: 0.94 ng/dL (ref 0.82–1.77)
T3, Free: 2.6 pg/mL (ref 2.0–4.4)
TSH: 2.36 u[IU]/mL (ref 0.450–4.500)

## 2024-03-19 LAB — VITAMIN B12: Vitamin B-12: 644 pg/mL (ref 232–1245)

## 2024-03-19 LAB — VITAMIN D 25 HYDROXY (VIT D DEFICIENCY, FRACTURES): Vit D, 25-Hydroxy: 32.9 ng/mL (ref 30.0–100.0)

## 2024-03-19 LAB — MAGNESIUM: Magnesium: 2.1 mg/dL (ref 1.6–2.3)

## 2024-03-25 ENCOUNTER — Ambulatory Visit: Payer: Self-pay | Admitting: Cardiology

## 2024-04-01 ENCOUNTER — Telehealth: Payer: Self-pay | Admitting: Gastroenterology

## 2024-04-01 NOTE — Telephone Encounter (Signed)
 Inbound call from patient stating she started taking linzess  the end of December and at first she would have diarrhea 1-4 times a day to now being unable to have a bowel movement. Patient would like to speak to nurse and be advised on what to do  Please advise  Thank you

## 2024-04-02 NOTE — Telephone Encounter (Signed)
 Spoke with the patient. She reports the problem has righted itself without intervention. She never had any abdominal pain or bloating.

## 2024-04-06 ENCOUNTER — Ambulatory Visit (HOSPITAL_COMMUNITY)

## 2024-04-15 ENCOUNTER — Ambulatory Visit: Admitting: Family Medicine

## 2024-04-21 ENCOUNTER — Ambulatory Visit (HOSPITAL_COMMUNITY)

## 2024-04-29 ENCOUNTER — Ambulatory Visit: Admitting: Physical Therapy

## 2024-05-14 ENCOUNTER — Ambulatory Visit: Admitting: Gastroenterology

## 2024-07-07 ENCOUNTER — Ambulatory Visit: Admitting: Cardiology
